# Patient Record
Sex: Female | Born: 1975 | Race: White | Hispanic: No | Smoking: Former smoker
Health system: Southern US, Community
[De-identification: ages and names within clinical notes are randomized; demographics above are authoritative.]

## PROBLEM LIST (undated history)

## (undated) DIAGNOSIS — Z87442 Personal history of urinary calculi: Secondary | ICD-10-CM

## (undated) DIAGNOSIS — E669 Obesity, unspecified: Secondary | ICD-10-CM

## (undated) DIAGNOSIS — O24414 Gestational diabetes mellitus in pregnancy, insulin controlled: Secondary | ICD-10-CM

## (undated) DIAGNOSIS — Z8759 Personal history of other complications of pregnancy, childbirth and the puerperium: Secondary | ICD-10-CM

## (undated) HISTORY — PX: RECTOVAGINAL FISTULA CLOSURE: SUR265

---

## 2005-12-15 ENCOUNTER — Ambulatory Visit: Payer: Self-pay | Admitting: Family Medicine

## 2006-08-29 ENCOUNTER — Ambulatory Visit: Payer: Self-pay | Admitting: Family Medicine

## 2006-09-04 ENCOUNTER — Ambulatory Visit: Payer: Self-pay | Admitting: Family Medicine

## 2006-12-19 ENCOUNTER — Ambulatory Visit: Payer: Self-pay | Admitting: Family Medicine

## 2006-12-21 ENCOUNTER — Ambulatory Visit: Payer: Self-pay | Admitting: Family Medicine

## 2013-06-03 ENCOUNTER — Encounter (HOSPITAL_COMMUNITY): Payer: Self-pay

## 2013-06-03 ENCOUNTER — Emergency Department (HOSPITAL_COMMUNITY): Payer: Self-pay

## 2013-06-03 ENCOUNTER — Emergency Department (HOSPITAL_COMMUNITY)
Admission: EM | Admit: 2013-06-03 | Discharge: 2013-06-03 | Disposition: A | Discharge status: Home / Self Care | Payer: Self-pay | Attending: Emergency Medicine | Admitting: Emergency Medicine

## 2013-06-03 DIAGNOSIS — F17200 Nicotine dependence, unspecified, uncomplicated: Secondary | ICD-10-CM | POA: Insufficient documentation

## 2013-06-03 DIAGNOSIS — N23 Unspecified renal colic: Secondary | ICD-10-CM | POA: Insufficient documentation

## 2013-06-03 DIAGNOSIS — N200 Calculus of kidney: Secondary | ICD-10-CM | POA: Insufficient documentation

## 2013-06-03 DIAGNOSIS — R112 Nausea with vomiting, unspecified: Secondary | ICD-10-CM | POA: Insufficient documentation

## 2013-06-03 DIAGNOSIS — Z3202 Encounter for pregnancy test, result negative: Secondary | ICD-10-CM | POA: Insufficient documentation

## 2013-06-03 LAB — URINALYSIS, ROUTINE W REFLEX MICROSCOPIC
Bilirubin Urine: NEGATIVE
Glucose, UA: NEGATIVE mg/dL
Nitrite: NEGATIVE
Specific Gravity, Urine: >1.03 — ABNORMAL HIGH (ref 1.005–1.030)
pH: 5.5 (ref 5.0–8.0)

## 2013-06-03 LAB — URINE MICROSCOPIC-ADD ON

## 2013-06-03 MED ORDER — METOCLOPRAMIDE HCL 5 MG/ML IJ SOLN
10.0000 mg | Freq: Once | INTRAMUSCULAR | Status: AC
  Administered 2013-06-03: 10 mg via INTRAVENOUS
  Filled 2013-06-03: qty 2

## 2013-06-03 MED ORDER — METOCLOPRAMIDE HCL 10 MG PO TABS: 10.0000 mg | ORAL_TABLET | Freq: Four times a day (QID) | ORAL | Status: DC | PRN

## 2013-06-03 MED ORDER — OXYCODONE-ACETAMINOPHEN 5-325 MG PO TABS: 2.0000 | ORAL_TABLET | ORAL | Status: DC | PRN

## 2013-06-03 MED ORDER — SODIUM CHLORIDE 0.9 % IV BOLUS (SEPSIS)
500.0000 mL | Freq: Once | INTRAVENOUS | Status: AC
  Administered 2013-06-03: 500 mL via INTRAVENOUS

## 2013-06-03 MED ORDER — ONDANSETRON 8 MG PO TBDP: ORAL_TABLET | ORAL | Status: DC

## 2013-06-03 MED ORDER — KETOROLAC TROMETHAMINE 30 MG/ML IJ SOLN
30.0000 mg | Freq: Once | INTRAMUSCULAR | Status: AC
  Administered 2013-06-03: 30 mg via INTRAVENOUS
  Filled 2013-06-03: qty 1

## 2013-06-03 MED ORDER — HYDROMORPHONE HCL PF 1 MG/ML IJ SOLN
1.0000 mg | Freq: Once | INTRAMUSCULAR | Status: AC
  Administered 2013-06-03: 1 mg via INTRAVENOUS
  Filled 2013-06-03: qty 1

## 2013-06-03 NOTE — ED Notes (Signed)
Pt reports chills and right flank pain for 2 hours pta, denies any blood in her urine, no h/o stones.  +nausea and vomited x1.  no fever.

## 2013-06-03 NOTE — ED Provider Notes (Signed)
History     This chart was scribed for Hurman Horn, MD, MD by Smitty Pluck, ED Scribe. The patient was seen in room APA18/APA18 and the patient's care was started at 10:24 AM.   CSN: [id number]  Arrival date & time 06/03/13  1011   Chief Complaint  Patient presents with  . Flank Pain  . Nausea    The history is provided by the patient and medical records. No language interpreter was used.   HPI Comments: Martha Morgan is a 37 y.o. female who presents to the Emergency Department complaining of constant, severe right flank pain onset 2 hours PTA today with sudden onset. She reports that she has associated nausea and vomiting. Pt denies being pregnant. Pt denies blood in stool, fever, chills, radiation of pain, dysuria, vaginal discharge, leg pain, diarrhea, weakness, cough, SOB and any other pain. Denies hx of similar pain.    History reviewed. No pertinent past medical history.  Past Surgical History  Procedure Laterality Date  . Rectovaginal fistula closure      No family history on file.  History  Substance Use Topics  . Smoking status: Current Every Day Smoker  . Smokeless tobacco: Not on file  . Alcohol Use: No    OB History   Grav Para Term Preterm Abortions TAB SAB Ect Mult Living                  Review of Systems A complete 10 system review of systems was obtained and all systems are negative except as noted in the HPI and PMH.   Allergies  Review of patient's allergies indicates no known allergies.  Home Medications   Current Outpatient Rx  Name  Route  Sig  Dispense  Refill  . metoCLOPramide (REGLAN) 10 MG tablet   Oral   Take 1 tablet (10 mg total) by mouth every 6 (six) hours as needed (nausea/headache).   6 tablet   0   . ondansetron (ZOFRAN ODT) 8 MG disintegrating tablet      8mg  ODT q4 hours prn nausea   4 tablet   0   . oxyCODONE-acetaminophen (PERCOCET) 5-325 MG per tablet   Oral   Take 2 tablets by mouth every 4 (four) hours as  needed for pain.   20 tablet   0     BP 114/73  Pulse 66  Temp(Src) 97.8 F (36.6 C) (Oral)  Resp 16  Ht 5\' 6"  (1.676 m)  Wt 191 lb (86.637 kg)  BMI 30.84 kg/m2  SpO2 100%  LMP 05/10/2013  Physical Exam  Nursing note and vitals reviewed. Constitutional:  Awake, alert, nontoxic appearance.  HENT:  Head: Atraumatic.  Eyes: Right eye exhibits no discharge. Left eye exhibits no discharge.  Neck: Neck supple.  Pulmonary/Chest: Effort normal. She exhibits no tenderness.  Abdominal: Soft. There is no rebound and no guarding.  Mild tenderness to right abdomen Mild right CVA tenderness No left CVA tenderness   Musculoskeletal: She exhibits no tenderness.  Baseline ROM, no obvious new focal weakness. No mid back tenderness  Neurological:  Mental status and motor strength appears baseline for patient and situation.  Skin: No rash noted.  Psychiatric: She has a normal mood and affect.    ED Course  Procedures (including critical care time) DIAGNOSTIC STUDIES: Oxygen Saturation is 100% on room air, normal by my interpretation.    COORDINATION OF CARE: 10:26 AM Patient / Family / Caregiver understand and agree with initial ED impression and  plan with expectations set for ED visit.  Pt feels improved after observation and/or treatment in ED.Patient / Family / Caregiver informed of clinical course, understand medical decision-making process, and agree with plan. Medications  sodium chloride 0.9 % bolus 500 mL (0 mLs Intravenous Stopped 06/03/13 1154)  HYDROmorphone (DILAUDID) injection 1 mg (1 mg Intravenous Given 06/03/13 1047)  ketorolac (TORADOL) 30 MG/ML injection 30 mg (30 mg Intravenous Given 06/03/13 1047)  metoCLOPramide (REGLAN) injection 10 mg (10 mg Intravenous Given 06/03/13 1046)      Labs Reviewed  URINALYSIS, ROUTINE W REFLEX MICROSCOPIC - Abnormal; Notable for the following:    Specific Gravity, Urine >1.030 (*)    Hgb urine dipstick LARGE (*)    Protein, ur  TRACE (*)    All other components within normal limits  URINE MICROSCOPIC-ADD ON - Abnormal; Notable for the following:    Squamous Epithelial / LPF FEW (*)    All other components within normal limits  PREGNANCY, URINE   Ct Abdomen Pelvis Wo Contrast  06/03/2013   *RADIOLOGY REPORT*  Clinical Data:  Right-sided flank pain.  History stones.  CT ABDOMEN AND PELVIS WITHOUT CONTRAST (CT UROGRAM)  Technique: Contiguous axial images of the abdomen and pelvis without oral or intravenous contrast were obtained.  Comparison: None  Findings:  Exam is limited for evaluation of entities other than urinary tract calculi due to lack of oral or intravenous contrast.   Lung bases:  Normal  Abdomen/pelvis:  Moderate hepatic steatosis, without focal liver lesions.  There is sparing of steatosis adjacent the gallbladder.  Normal spleen, stomach, pancreas, gallbladder, biliary tract, adrenal glands.  Bilateral nephrolithiasis.  Mild to moderate right-sided hydroureteronephrosis.  This continues to the level of 5 mm distal right ureteric calculus.  No retroperitoneal or retrocrural adenopathy.  Scattered colonic diverticula.  Normal terminal ileum and appendix. Normal small bowel without abdominal ascites.    No pelvic adenopathy.    Normal urinary bladder and uterus.  No adnexal mass.  Bones/Musculoskeletal:  No acute osseous abnormality.  Disc bulges at L4-L5 and L5-S1.  IMPRESSION:  1.  Mild to moderate obstruction secondary to a distal right ureteric calculus. 2.  Bilateral nephrolithiasis. 3.  Hepatic steatosis. 4.  Normal appendix.   Original Report Authenticated By: Jeronimo Greaves, M.D.     1. Renal colic on right side   2. Nephrolithiasis       MDM  I doubt any other EMC precluding discharge at this time including, but not necessarily limited to the following:SBI.      I personally performed the services described in this documentation, which was scribed in my presence. The recorded information has been  reviewed and is accurate.    Hurman Horn, MD 06/03/13 2230

## 2014-08-18 ENCOUNTER — Inpatient Hospital Stay (HOSPITAL_COMMUNITY)
Admission: EM | Admit: 2014-08-18 | Discharge: 2014-08-20 | DRG: 776 | Disposition: A | Discharge status: Home / Self Care | Payer: Medicaid Other | Attending: Family Medicine | Admitting: Family Medicine

## 2014-08-18 ENCOUNTER — Encounter (HOSPITAL_COMMUNITY): Payer: Self-pay | Admitting: Emergency Medicine

## 2014-08-18 ENCOUNTER — Emergency Department (HOSPITAL_COMMUNITY): Payer: Medicaid Other

## 2014-08-18 DIAGNOSIS — Z87891 Personal history of nicotine dependence: Secondary | ICD-10-CM | POA: Diagnosis not present

## 2014-08-18 DIAGNOSIS — R030 Elevated blood-pressure reading, without diagnosis of hypertension: Secondary | ICD-10-CM | POA: Diagnosis not present

## 2014-08-18 DIAGNOSIS — R609 Edema, unspecified: Secondary | ICD-10-CM

## 2014-08-18 DIAGNOSIS — I5031 Acute diastolic (congestive) heart failure: Secondary | ICD-10-CM | POA: Diagnosis present

## 2014-08-18 DIAGNOSIS — O1494 Unspecified pre-eclampsia, complicating childbirth: Secondary | ICD-10-CM | POA: Diagnosis present

## 2014-08-18 DIAGNOSIS — I498 Other specified cardiac arrhythmias: Secondary | ICD-10-CM | POA: Diagnosis present

## 2014-08-18 DIAGNOSIS — IMO0002 Reserved for concepts with insufficient information to code with codable children: Principal | ICD-10-CM | POA: Diagnosis present

## 2014-08-18 DIAGNOSIS — I059 Rheumatic mitral valve disease, unspecified: Secondary | ICD-10-CM

## 2014-08-18 DIAGNOSIS — E669 Obesity, unspecified: Secondary | ICD-10-CM | POA: Diagnosis present

## 2014-08-18 DIAGNOSIS — Z8759 Personal history of other complications of pregnancy, childbirth and the puerperium: Secondary | ICD-10-CM

## 2014-08-18 DIAGNOSIS — R000 Tachycardia, unspecified: Secondary | ICD-10-CM | POA: Diagnosis present

## 2014-08-18 DIAGNOSIS — Z8742 Personal history of other diseases of the female genital tract: Secondary | ICD-10-CM

## 2014-08-18 DIAGNOSIS — O24414 Gestational diabetes mellitus in pregnancy, insulin controlled: Secondary | ICD-10-CM

## 2014-08-18 DIAGNOSIS — O99815 Abnormal glucose complicating the puerperium: Secondary | ICD-10-CM | POA: Diagnosis present

## 2014-08-18 DIAGNOSIS — Z794 Long term (current) use of insulin: Secondary | ICD-10-CM

## 2014-08-18 DIAGNOSIS — R0902 Hypoxemia: Secondary | ICD-10-CM | POA: Diagnosis present

## 2014-08-18 DIAGNOSIS — O99810 Abnormal glucose complicating pregnancy: Secondary | ICD-10-CM

## 2014-08-18 HISTORY — DX: Personal history of other complications of pregnancy, childbirth and the puerperium: Z87.59

## 2014-08-18 HISTORY — DX: Obesity, unspecified: E66.9

## 2014-08-18 HISTORY — DX: Gestational diabetes mellitus in pregnancy, insulin controlled: O24.414

## 2014-08-18 LAB — URINALYSIS, ROUTINE W REFLEX MICROSCOPIC
BILIRUBIN URINE: NEGATIVE
GLUCOSE, UA: NEGATIVE mg/dL
KETONES UR: NEGATIVE mg/dL
Nitrite: NEGATIVE
PROTEIN: 30 mg/dL — AB
Specific Gravity, Urine: <1.005 — ABNORMAL LOW (ref 1.005–1.030)
UROBILINOGEN UA: 0.2 mg/dL (ref 0.0–1.0)
pH: 6.5 (ref 5.0–8.0)

## 2014-08-18 LAB — COMPREHENSIVE METABOLIC PANEL
ALBUMIN: 2.3 g/dL — AB (ref 3.5–5.2)
ALK PHOS: 117 U/L (ref 39–117)
ALT: 31 U/L (ref 0–35)
ANION GAP: 11 (ref 5–15)
AST: 35 U/L (ref 0–37)
BILIRUBIN TOTAL: 0.2 mg/dL — AB (ref 0.3–1.2)
BUN: 9 mg/dL (ref 6–23)
CHLORIDE: 103 meq/L (ref 96–112)
CO2: 26 mEq/L (ref 19–32)
CREATININE: 0.77 mg/dL (ref 0.50–1.10)
Calcium: 8.3 mg/dL — ABNORMAL LOW (ref 8.4–10.5)
GLUCOSE: 138 mg/dL — AB (ref 70–99)
POTASSIUM: 4.3 meq/L (ref 3.7–5.3)
Sodium: 140 mEq/L (ref 137–147)
Total Protein: 6.3 g/dL (ref 6.0–8.3)

## 2014-08-18 LAB — TSH: TSH: 0.975 u[IU]/mL (ref 0.350–4.500)

## 2014-08-18 LAB — URINE MICROSCOPIC-ADD ON

## 2014-08-18 LAB — PRO B NATRIURETIC PEPTIDE: PRO B NATRI PEPTIDE: 471.9 pg/mL — AB (ref 0–125)

## 2014-08-18 LAB — BLOOD GAS, ARTERIAL
ACID-BASE EXCESS: 1.2 mmol/L (ref 0.0–2.0)
BICARBONATE: 24.6 meq/L — AB (ref 20.0–24.0)
DRAWN BY: 27407
O2 CONTENT: 2 L/min
O2 SAT: 95.1 %
PCO2 ART: 34.3 mmHg — AB (ref 35.0–45.0)
PO2 ART: 74.9 mmHg — AB (ref 80.0–100.0)
Patient temperature: 37
TCO2: 23.2 mmol/L (ref 0–100)
pH, Arterial: 7.47 — ABNORMAL HIGH (ref 7.350–7.450)

## 2014-08-18 LAB — CBC WITH DIFFERENTIAL/PLATELET
BASOS PCT: 0 % (ref 0–1)
Basophils Absolute: 0 10*3/uL (ref 0.0–0.1)
Eosinophils Absolute: 0.2 10*3/uL (ref 0.0–0.7)
Eosinophils Relative: 2 % (ref 0–5)
HEMATOCRIT: 24.6 % — AB (ref 36.0–46.0)
HEMOGLOBIN: 7.7 g/dL — AB (ref 12.0–15.0)
LYMPHS ABS: 1 10*3/uL (ref 0.7–4.0)
Lymphocytes Relative: 9 % — ABNORMAL LOW (ref 12–46)
MCH: 25.5 pg — AB (ref 26.0–34.0)
MCHC: 31.3 g/dL (ref 30.0–36.0)
MCV: 81.5 fL (ref 78.0–100.0)
MONO ABS: 0.5 10*3/uL (ref 0.1–1.0)
MONOS PCT: 5 % (ref 3–12)
NEUTROS ABS: 9 10*3/uL — AB (ref 1.7–7.7)
NEUTROS PCT: 84 % — AB (ref 43–77)
Platelets: 327 10*3/uL (ref 150–400)
RBC: 3.02 MIL/uL — AB (ref 3.87–5.11)
RDW: 14.9 % (ref 11.5–15.5)
WBC: 10.8 10*3/uL — ABNORMAL HIGH (ref 4.0–10.5)

## 2014-08-18 LAB — GLUCOSE, CAPILLARY
GLUCOSE-CAPILLARY: 98 mg/dL (ref 70–99)
GLUCOSE-CAPILLARY: 100 mg/dL — AB (ref 70–99)
Glucose-Capillary: 86 mg/dL (ref 70–99)

## 2014-08-18 LAB — MAGNESIUM: Magnesium: 1.9 mg/dL (ref 1.5–2.5)

## 2014-08-18 LAB — PROTEIN / CREATININE RATIO, URINE
CREATININE, URINE: 47.3 mg/dL
PROTEIN CREATININE RATIO: 0.68 — AB (ref 0.00–0.15)
TOTAL PROTEIN, URINE: 32 mg/dL

## 2014-08-18 LAB — TROPONIN I: Troponin I: <0.3 ng/mL (ref <0.30)

## 2014-08-18 LAB — PROTIME-INR
INR: 0.89 (ref 0.00–1.49)
PROTHROMBIN TIME: 12.1 s (ref 11.6–15.2)

## 2014-08-18 LAB — MRSA PCR SCREENING: MRSA by PCR: NEGATIVE

## 2014-08-18 MED ORDER — MAGNESIUM SULFATE 40 MG/ML IJ SOLN
INTRAMUSCULAR | Status: AC
  Filled 2014-08-18: qty 50

## 2014-08-18 MED ORDER — FUROSEMIDE 10 MG/ML IJ SOLN
10.0000 mg | Freq: Once | INTRAMUSCULAR | Status: AC
  Administered 2014-08-18: 10 mg via INTRAVENOUS
  Filled 2014-08-18: qty 2

## 2014-08-18 MED ORDER — ENOXAPARIN SODIUM 60 MG/0.6ML ~~LOC~~ SOLN
50.0000 mg | SUBCUTANEOUS | Status: DC
  Administered 2014-08-19 – 2014-08-20 (×2): 50 mg via SUBCUTANEOUS
  Filled 2014-08-18 (×2): qty 0.6

## 2014-08-18 MED ORDER — TETANUS-DIPHTH-ACELL PERTUSSIS 5-2.5-18.5 LF-MCG/0.5 IM SUSP
0.5000 mL | Freq: Once | INTRAMUSCULAR | Status: DC
  Filled 2014-08-18: qty 0.5

## 2014-08-18 MED ORDER — LABETALOL HCL 5 MG/ML IV SOLN
10.0000 mg | Freq: Once | INTRAVENOUS | Status: AC
  Administered 2014-08-18: 10 mg via INTRAVENOUS
  Filled 2014-08-18: qty 4

## 2014-08-18 MED ORDER — ENOXAPARIN SODIUM 40 MG/0.4ML ~~LOC~~ SOLN: 40.0000 mg | SUBCUTANEOUS | Status: DC

## 2014-08-18 MED ORDER — SENNOSIDES-DOCUSATE SODIUM 8.6-50 MG PO TABS: 2.0000 | ORAL_TABLET | ORAL | Status: DC

## 2014-08-18 MED ORDER — ONDANSETRON HCL 4 MG PO TABS: 4.0000 mg | ORAL_TABLET | ORAL | Status: DC | PRN

## 2014-08-18 MED ORDER — WITCH HAZEL-GLYCERIN EX PADS: 1.0000 "application " | MEDICATED_PAD | CUTANEOUS | Status: DC | PRN

## 2014-08-18 MED ORDER — SIMETHICONE 80 MG PO CHEW
80.0000 mg | CHEWABLE_TABLET | ORAL | Status: DC
  Administered 2014-08-20: 80 mg via ORAL
  Filled 2014-08-18: qty 1

## 2014-08-18 MED ORDER — LANOLIN HYDROUS EX OINT: 1.0000 "application " | TOPICAL_OINTMENT | CUTANEOUS | Status: DC | PRN

## 2014-08-18 MED ORDER — SODIUM CHLORIDE 0.9 % IJ SOLN
3.0000 mL | Freq: Two times a day (BID) | INTRAMUSCULAR | Status: DC
  Administered 2014-08-18 – 2014-08-19 (×3): 3 mL via INTRAVENOUS

## 2014-08-18 MED ORDER — ONDANSETRON HCL 4 MG/2ML IJ SOLN: 4.0000 mg | INTRAMUSCULAR | Status: DC | PRN

## 2014-08-18 MED ORDER — INSULIN ASPART 100 UNIT/ML ~~LOC~~ SOLN: 3.0000 [IU] | Freq: Three times a day (TID) | SUBCUTANEOUS | Status: DC

## 2014-08-18 MED ORDER — METOPROLOL TARTRATE 25 MG PO TABS
25.0000 mg | ORAL_TABLET | Freq: Two times a day (BID) | ORAL | Status: DC
  Administered 2014-08-18 – 2014-08-20 (×4): 25 mg via ORAL
  Filled 2014-08-18 (×5): qty 1

## 2014-08-18 MED ORDER — SODIUM CHLORIDE 0.9 % IV SOLN
250.0000 mL | INTRAVENOUS | Status: DC | PRN
  Administered 2014-08-19: 250 mL via INTRAVENOUS

## 2014-08-18 MED ORDER — MAGNESIUM SULFATE BOLUS VIA INFUSION
4.0000 g | Freq: Once | INTRAVENOUS | Status: AC
  Administered 2014-08-18: 4 g via INTRAVENOUS

## 2014-08-18 MED ORDER — DIPHENHYDRAMINE HCL 25 MG PO CAPS: 25.0000 mg | ORAL_CAPSULE | Freq: Four times a day (QID) | ORAL | Status: DC | PRN

## 2014-08-18 MED ORDER — SIMETHICONE 80 MG PO CHEW: 80.0000 mg | CHEWABLE_TABLET | ORAL | Status: DC | PRN

## 2014-08-18 MED ORDER — FUROSEMIDE 10 MG/ML IJ SOLN: 40.0000 mg | Freq: Once | INTRAMUSCULAR | Status: DC

## 2014-08-18 MED ORDER — ZOLPIDEM TARTRATE 5 MG PO TABS: 5.0000 mg | ORAL_TABLET | Freq: Every evening | ORAL | Status: DC | PRN

## 2014-08-18 MED ORDER — OXYCODONE HCL 5 MG PO TABS: 5.0000 mg | ORAL_TABLET | ORAL | Status: DC | PRN

## 2014-08-18 MED ORDER — MAGNESIUM SULFATE 40 G IN LACTATED RINGERS - SIMPLE
2.0000 g/h | INTRAVENOUS | Status: DC
  Administered 2014-08-18: 2 g/h via INTRAVENOUS
  Filled 2014-08-18: qty 500

## 2014-08-18 MED ORDER — DIBUCAINE 1 % RE OINT: 1.0000 "application " | TOPICAL_OINTMENT | RECTAL | Status: DC | PRN

## 2014-08-18 MED ORDER — SODIUM CHLORIDE 0.9 % IJ SOLN
3.0000 mL | INTRAMUSCULAR | Status: DC | PRN
  Administered 2014-08-19 (×2): 3 mL via INTRAVENOUS

## 2014-08-18 MED ORDER — SIMETHICONE 80 MG PO CHEW
80.0000 mg | CHEWABLE_TABLET | Freq: Three times a day (TID) | ORAL | Status: DC
  Administered 2014-08-20: 80 mg via ORAL
  Filled 2014-08-18: qty 1

## 2014-08-18 MED ORDER — LACTATED RINGERS IV SOLN
INTRAVENOUS | Status: DC
  Administered 2014-08-18: 12:00:00 via INTRAVENOUS

## 2014-08-18 MED ORDER — DOCUSATE SODIUM 100 MG PO CAPS
100.0000 mg | ORAL_CAPSULE | Freq: Two times a day (BID) | ORAL | Status: DC
  Filled 2014-08-18: qty 1

## 2014-08-18 MED ORDER — INSULIN ASPART 100 UNIT/ML ~~LOC~~ SOLN: 0.0000 [IU] | Freq: Three times a day (TID) | SUBCUTANEOUS | Status: DC

## 2014-08-18 MED ORDER — IOHEXOL 350 MG/ML SOLN
100.0000 mL | Freq: Once | INTRAVENOUS | Status: AC | PRN
  Administered 2014-08-18: 100 mL via INTRAVENOUS

## 2014-08-18 MED ORDER — PRENATAL MULTIVITAMIN CH
1.0000 | ORAL_TABLET | Freq: Every day | ORAL | Status: DC
  Filled 2014-08-18: qty 1

## 2014-08-18 MED ORDER — OXYCODONE-ACETAMINOPHEN 5-325 MG PO TABS
1.0000 | ORAL_TABLET | ORAL | Status: DC | PRN
  Administered 2014-08-18 – 2014-08-19 (×4): 1 via ORAL
  Filled 2014-08-18 (×4): qty 1

## 2014-08-18 MED ORDER — LABETALOL HCL 5 MG/ML IV SOLN: 10.0000 mg | INTRAVENOUS | Status: DC | PRN

## 2014-08-18 NOTE — ED Notes (Signed)
Pt reports having a c-section on Sept 2, states she started feeling sob last night approx 11 pm  Pt denies pain anywhere except with cough and movement

## 2014-08-18 NOTE — Progress Notes (Signed)
Echo Lab  2D Echocardiogram completed.  Sayvon Arterberry L Emmaleah Meroney, RDCS 08/18/2014 1:56 PM

## 2014-08-18 NOTE — ED Provider Notes (Signed)
CSN: [id number]     Arrival date & time 08/18/14  0509 History   First MD Initiated Contact with Patient 08/18/14 [phone]     Chief Complaint  Patient presents with  . Shortness of Breath     (Consider location/radiation/quality/duration/timing/severity/associated sxs/prior Treatment) HPI Patient presents with shortness of breath starting at 11 PM last night. She states the pain was gradual in onset. Patient states she is unable to take a full breath. She has a mild nonproductive cough. She states she has some mild chest pain with coughing. Denies fever or chills. Patient had a C-section 5 days ago. No complications from such. Patient has bilateral lower extremity swelling which she states is unchanged from her baseline.  History reviewed. No pertinent past medical history. Past Surgical History  Procedure Laterality Date  . Rectovaginal fistula closure    . Cesarean section     No family history on file. History  Substance Use Topics  . Smoking status: Former Games developer  . Smokeless tobacco: Not on file  . Alcohol Use: No   OB History   Grav Para Term Preterm Abortions TAB SAB Ect Mult Living                 Review of Systems  Constitutional: Negative for fever and chills.  Respiratory: Positive for cough, chest tightness and shortness of breath. Negative for wheezing.   Cardiovascular: Positive for chest pain and leg swelling. Negative for palpitations.  Gastrointestinal: Negative for nausea, vomiting and abdominal pain.  Musculoskeletal: Negative for back pain, neck pain and neck stiffness.  Skin: Negative for rash and wound.  Neurological: Negative for dizziness, weakness, light-headedness, numbness and headaches.  All other systems reviewed and are negative.     Allergies  Review of patient's allergies indicates no known allergies.  Home Medications   Prior to Admission medications   Medication Sig Start Date End Date Taking? Authorizing Provider  docusate sodium  (COLACE) 100 MG capsule Take 100 mg by mouth 2 (two) times daily.   Yes Historical Provider, MD  glyBURIDE (DIABETA) 5 MG tablet Take 5 mg by mouth daily with breakfast.   Yes Historical Provider, MD  ibuprofen (ADVIL,MOTRIN) 800 MG tablet Take 800 mg by mouth every 8 (eight) hours as needed.   Yes Historical Provider, MD  oxycodone (OXY-IR) 5 MG capsule Take 5 mg by mouth every 4 (four) hours as needed.   Yes Historical Provider, MD  metoCLOPramide (REGLAN) 10 MG tablet Take 1 tablet (10 mg total) by mouth every 6 (six) hours as needed (nausea/headache). 06/03/13   Hurman Horn, MD  ondansetron (ZOFRAN ODT) 8 MG disintegrating tablet  ODT q4 hours prn nausea 06/03/13   Hurman Horn, MD  oxyCODONE-acetaminophen (PERCOCET) 5-325 MG per tablet Take 2 tablets by mouth every 4 (four) hours as needed for pain. 06/03/13   Hurman Horn, MD   BP 152/104  Pulse 122  Temp(Src) 98.3 F (36.8 C) (Oral)  Resp 21  Ht  (1.676 m)  Wt 240 lb (108.863 kg)  BMI 38.76 kg/m2  SpO2 100% Physical Exam  Nursing note and vitals reviewed. Constitutional: She is oriented to person, place, and time. She appears well-developed and well-nourished. No distress.   tearful and anxious  HENT:  Head: Normocephalic and atraumatic.  Mouth/Throat: Oropharynx is clear and moist. No oropharyngeal exudate.  Eyes: EOM are normal. Pupils are equal, round, and reactive to light.  Neck: Normal range of motion. Neck supple.  Cardiovascular: Normal  rate and regular rhythm.   Pulmonary/Chest: Effort normal and breath sounds normal. No respiratory distress. She has no wheezes. She has no rales.  Tachypnea. Decreased air movement.  Abdominal: Soft. Bowel sounds are normal. She exhibits no distension and no mass. There is no tenderness. There is no rebound and no guarding.  Musculoskeletal: Normal range of motion. She exhibits no edema and no tenderness.  Bilateral 2+ pitting edema to the lower extremities. No definite calf  tenderness or swelling.  Neurological: She is alert and oriented to person, place, and time.  Ultram is without deficit sensation is grossly intact.  Skin: Skin is warm and dry. No rash noted. No erythema.    ED Course  Procedures (including critical care time) Labs Review Labs Reviewed  CBC WITH DIFFERENTIAL - Abnormal; Notable for the following:    WBC 10.8 (*)    RBC 3.02 (*)    Hemoglobin 7.7 (*)    HCT 24.6 (*)    MCH 25.5 (*)    Neutrophils Relative % 84 (*)    Neutro Abs 9.0 (*)    Lymphocytes Relative 9 (*)    All other components within normal limits  COMPREHENSIVE METABOLIC PANEL - Abnormal; Notable for the following:    Glucose, Bld 138 (*)    Calcium 8.3 (*)    Albumin 2.3 (*)    Total Bilirubin 0.2 (*)    All other components within normal limits  PRO B NATRIURETIC PEPTIDE - Abnormal; Notable for the following:    Pro B Natriuretic peptide (BNP) 471.9 (*)    All other components within normal limits  TROPONIN I  URINALYSIS, ROUTINE W REFLEX MICROSCOPIC  BLOOD GAS, ARTERIAL  MAGNESIUM    Imaging Review Ct Angio Chest Pe W/cm &/or Wo Cm  08/18/2014   CLINICAL DATA:  Shortness of breath.  C-section 8 days ago.  EXAM: CT ANGIOGRAPHY CHEST WITH CONTRAST  TECHNIQUE: Multidetector CT imaging of the chest was performed using the standard protocol during bolus administration of intravenous contrast. Multiplanar CT image reconstructions and MIPs were obtained to evaluate the vascular anatomy.  CONTRAST:  OMNIPAQUE IOHEXOL 350 MG/ML SOLN  COMPARISON:  None.  FINDINGS: Technically adequate study with moderately good opacification of the central and segmental pulmonary arteries. No focal filling defects. No evidence of significant pulmonary embolus.  Normal heart size. Normal caliber thoracic aorta. No significant lymphadenopathy in the chest. Esophagus is decompressed.  Small bilateral pleural effusions with atelectasis in the lung bases. No focal consolidation. No  pneumothorax. Airways appear patent with mild diffuse airways thickening suggesting bronchitic changes.  Included portions of the upper abdominal organs demonstrate no focal abnormality. Mild degenerative changes in the spine. No destructive bone lesions.  Review of the MIP images confirms the above findings.  IMPRESSION: No evidence of significant pulmonary embolus. Small bilateral pleural effusions with basilar atelectasis.   Electronically Signed   By: Burman Nieves M.D.   On: 08/18/2014 06:43   Dg Chest Port 1 View  08/18/2014   CLINICAL DATA:  Shortness of breath last night. C-section on September 2.  EXAM: PORTABLE CHEST - 1 VIEW  COMPARISON:  None.  FINDINGS: Shallow inspiration. Heart size and pulmonary vascularity are normal. Soft tissue attenuation in the lung bases. No focal airspace disease or consolidation is suggested. No pneumothorax. No blunting of costophrenic angles.  IMPRESSION: No active disease.   Electronically Signed   By: Burman Nieves M.D.   On: 08/18/2014 05:55     EKG  Interpretation None      Date: 08/18/2014  Rate: 129  Rhythm: sinus tachycardia  QRS Axis: normal  Intervals: normal  ST/T Wave abnormalities: normal  Conduction Disutrbances:none  Narrative Interpretation:   Old EKG Reviewed: none available   MDM   Final diagnoses:  None    Given tachycardia an acute onset of shortness of breath PE is a definite consideration as well as pulmonary edema from preeclampsia. Will get CT angio of the chest to evaluate.  Patient states her shortness of breath is mildly improved. Her blood pressure improved in the emergency department. CT without any acute findings. Mild elevation in her BNP  Discussed with Dr. Mahala Menghini. Recommends obtaining ABG and magnesium level. Will see the patient in emergency department and disposition.  Patient's blood pressure mildly elevated her initial improvement. We'll give dose of labetolol.   Loren Racer, MD 08/18/14 [phone]

## 2014-08-18 NOTE — ED Notes (Signed)
Medical records received from [place]. Pt informed that someone would have to be with her for baby to stay in room with her.

## 2014-08-18 NOTE — ED Notes (Signed)
Pt ambulated to restroom to provide urine specimen for urine protein

## 2014-08-18 NOTE — H&P (Signed)
[facility] History and Physical  Martha Morgan NUU:[id number] DOB: 08/18/1976 DOA: 08/18/2014  Referring physician: ED PCP: No PCP Per Patient  Specialists: OB-GYN  Chief Complaint: SOB, LE edema and pleurisy  HPI: 38 year old G4 P2 known class B. diabetic, pregnancy-induced hypertension, history of nephrolithiasis 06/03/13 recently delivered 08/13/14 for [facility] via C-section-she had her do this 24-hour 14 done which indicated she was developing eclampsia and subsequently decision was made 2 do section at 37 weeks.  She had delivery of a baby boy and is caring for the child very much by herself at home.  She went home and was doing fine until 11 p.m. 08/17/14 when she developed acute shortness of breath. States that she was developing "nasal congestion pain when coughing". She states she's been having swelling in her lower extremities for 2-3 weeks, no headaches, no blurred vision, no seizure-like activity. She states that she's not really been coughing up any sputum and was feeling fine on leaving the hospital. She's not a smoker not a drinker She denies any chest pain She denies any fever or chills or foul smelling lochia. Her line of incision across the section is clean and clear and she is had no pain there or not discharge from that area except when she coughs when she has some pain. She decided to come to the emergency room at [facility] because she has no one really to help take care of her child at home other than the sister-in-law.  Emergency room workup chest x-ray showed shallow inspiration no active disease CT angiogram chest showed small bilateral pleural effusions with basilar atelectasis EKG showed sinus tachycardia PR interval 0.12 QRS axis 45 questionable criterion for LVH LFTs are within normal limits at the lower limits in the low, total bilirubin is decreased  0.2 troponin 0.30, proBNP 471.9   hemoglobin 7.7 WBC 10.8 platelets 327 INR not  performed ABG 7.47/34/74 0.9  Review of Systems: 12 system organ reviewed and negative except as above   History reviewed. No pertinent past medical history. Past Surgical History  Procedure Laterality Date  . Rectovaginal fistula closure    . Cesarean section     Social History:  History   Social History Narrative  . No narrative on file    No Known Allergies  No family history on file.   Prior to Admission medications   Medication Sig Start Date End Date Taking? Authorizing Provider  docusate sodium (COLACE) 100 MG capsule Take 100 mg by mouth 2 (two) times daily.   Yes Historical Provider, MD  glyBURIDE (DIABETA) 5 MG tablet Take 5 mg by mouth daily with breakfast.   Yes Historical Provider, MD  ibuprofen (ADVIL,MOTRIN) 800 MG tablet Take 800 mg by mouth every 8 (eight) hours as needed.   Yes Historical Provider, MD  oxycodone (OXY-IR) 5 MG capsule Take 5 mg by mouth every 4 (four) hours as needed.   Yes Historical Provider, MD  metoCLOPramide (REGLAN) 10 MG tablet Take 1 tablet (10 mg total) by mouth every 6 (six) hours as needed (nausea/headache). 06/03/13   Hurman Horn, MD  ondansetron (ZOFRAN ODT) 8 MG disintegrating tablet  ODT q4 hours prn nausea 06/03/13   Hurman Horn, MD  oxyCODONE-acetaminophen (PERCOCET) 5-325 MG per tablet Take 2 tablets by mouth every 4 (four) hours as needed for pain. 06/03/13   Hurman Horn, MD   Physical Exam: Filed Vitals:   08/18/14 0530 08/18/14 0615 08/18/14 0630 08/18/14 0715  BP:  170/109 135/89 127/77 152/104  Pulse: 134 117 127 122  Temp:      TempSrc:      Resp: Height:      Weight:      SpO2: 98% 100% 96% 100%     General:  EOMI, NCAT  Eyes: no blurred or double vision-fundus appears to have a little bit of neovascularization but no significant htn silver-wiring  ENT: soft supple, non tender-sinuses not painfu  Neck: soft supple  Cardiovascular:  s1 s2 slighlty tachycardic  Respiratory: clear, no added  sound  Abdomen:  Soft, Pfannaenstein incision clean   Skin:  Grade 2 LE edema  Musculoskeletal:  rom intact  Psychiatric:  Euthymic but upset she has to be hopsitlized and tearful  Neurologic: reflexes 2-3/3, no clonus.    Labs on Admission:  Basic Metabolic Panel:  Recent Labs Lab 08/18/14 0533  NA 140  K 4.3  CL 103  CO2 26  GLUCOSE 138*  BUN 9  CREATININE 0.77  CALCIUM 8.3*   Liver Function Tests:  Recent Labs Lab 08/18/14 0533  AST 35  ALT 31  ALKPHOS 117  BILITOT 0.2*  PROT 6.3  ALBUMIN 2.3*   No results found for this basename: LIPASE, AMYLASE,  in the last 168 hours No results found for this basename: AMMONIA,  in the last 168 hours CBC:  Recent Labs Lab 08/18/14 0533  WBC 10.8*  NEUTROABS 9.0*  HGB 7.7*  HCT 24.6*  MCV 81.5  PLT 327   Cardiac Enzymes:  Recent Labs Lab 08/18/14 0533  TROPONINI <0.30    BNP (last 3 results)  Recent Labs  08/18/14 0533  PROBNP 471.9*   CBG: No results found for this basename: GLUCAP,  in the last 168 hours  Radiological Exams on Admission: Ct Angio Chest Pe W/cm &/or Wo Cm  08/18/2014   CLINICAL DATA:  Shortness of breath.  C-section 8 days ago.  EXAM: CT ANGIOGRAPHY CHEST WITH CONTRAST  TECHNIQUE: Multidetector CT imaging of the chest was performed using the standard protocol during bolus administration of intravenous contrast. Multiplanar CT image reconstructions and MIPs were obtained to evaluate the vascular anatomy.  CONTRAST:  OMNIPAQUE IOHEXOL 350 MG/ML SOLN  COMPARISON:  None.  FINDINGS: Technically adequate study with moderately good opacification of the central and segmental pulmonary arteries. No focal filling defects. No evidence of significant pulmonary embolus.  Normal heart size. Normal caliber thoracic aorta. No significant lymphadenopathy in the chest. Esophagus is decompressed.  Small bilateral pleural effusions with atelectasis in the lung bases. No focal consolidation. No  pneumothorax. Airways appear patent with mild diffuse airways thickening suggesting bronchitic changes.  Included portions of the upper abdominal organs demonstrate no focal abnormality. Mild degenerative changes in the spine. No destructive bone lesions.  Review of the MIP images confirms the above findings.  IMPRESSION: No evidence of significant pulmonary embolus. Small bilateral pleural effusions with basilar atelectasis.   Electronically Signed   By: Burman Nieves M.D.   On: 08/18/2014 06:43   Dg Chest Port 1 View  08/18/2014   CLINICAL DATA:  Shortness of breath last night. C-section on September 2.  EXAM: PORTABLE CHEST - 1 VIEW  COMPARISON:  None.  FINDINGS: Shallow inspiration. Heart size and pulmonary vascularity are normal. Soft tissue attenuation in the lung bases. No focal airspace disease or consolidation is suggested. No pneumothorax. No blunting of costophrenic angles.  IMPRESSION: No active disease.   Electronically Signed  By: Burman Nieves M.D.   On: 08/18/2014 05:55     Assessme nt/Plan Principal Problem:   Hypoxia-likely secondary to mild decompensated heart failure. Patient will need echocardiogram in view of proBNP although this could be falsely elevated given her postpartum state. Her EKG does not show any findings of specific LVH. Active Problems:   History of pregnancy induced hypertension-discussed case with Dr. Alexia Freestone of OB/GYN-DDX = postpartum cardiomyopathy given grand-multipara state & obesity, but compensated by the fact that she may also have mild preeclampsia as her blood pressures were 170/110 on admission to the emergency room-she recommends getting a protein creatinine ratio stat as well as starting magnesium sulfate 4 g IV now then 2 g every 2 hourly. She has accepted the patient to [facility] in [place] where holistic care of the infant and patient can be performed. I do not have records from [facility] however though should be obtained to  complete care--we will try to obtain them and fax them over to women's Recommend frequent checks of neurological state as well as reflexes that she was slightly hyperreflexic during exam initially. Dr. Alexia Freestone does not think that we need magnesium levels at this point in time as creatinine is stable   Gestational diabetes mellitus in pregnancy, insulin controlled-we will start her on a.c. at bedtime coverage for the time being but this can be discontinued if she seems to be worsening and she'll have thank you for coverage   Obesity-outpatient counseling   Sinus tachycardia-probably been secondary to change in physiology from postpartum state as well as hypertension induced by preeclampsia. Magnesium 2 started . She's been given a dose of labetalol which we will continue every 4 when necessary   Cc time >65 min Transferred to [facility] under Dr. Leggett-greatly appreciate care and assistance  Mahala Menghini [facility] Pager [phone]  If 7PM-7AM, please contact night-coverage [email or web address] Password [email or web address] 08/18/2014, 7:26 AM

## 2014-08-18 NOTE — ED Notes (Signed)
Pt currently refusing to let me check fundus and pad stating,"I'd rather you not right now." Pt has been very agitates since learning she is unable to go home. Pt states she does not need a new pad at this time. Incision site/ dressing is CDI, which was viewed along with hospitalist as he was checking incision site.

## 2014-08-18 NOTE — Consult Note (Signed)
Primary Physician: Primary Cardiologist:  New   HPI: Asked to see re abnormal echo  Martha Morgan is a 38 yo G4P2 with history of gestational DM, preg induced HTN, Underwent C section 08/13/14 at 37 wks due to concerns for preeclampsia  Patietn says her BP had been good (140s) She noted edema with pregnancy that developed over the past couple months Yesterday she was at home   Became acutely SOB last night  Went to [facility] ER  .  Presented to [facility] with SOB that started 9/6  Had been developing LE edema for 2 to 3 wks.  There BP was signif elev at 152/104  P 122.  BNP increased at 471  CT negative for PE  Small bilateral effusions.  Patietn transferred to [facility] She was given IV lasix 10 mg and has some improvement ni her breathing though not back to normal Complains of nonprod cough.    Prior to pregnancy denied SOB  In other pregnancies and post partum did not have SOB.  FHX signif for CHF in mother  Dx'd 6 years ago  No reported CAD    Past Medical History  Diagnosis Date  . History of pregnancy induced hypertension   . Obesity   . Gestational diabetes mellitus in pregnancy, insulin controlled     Medications Prior to Admission  Medication Sig Dispense Refill  . docusate sodium (COLACE) 100 MG capsule Take 100 mg by mouth 2 (two) times daily.      Marland Kitchen glyBURIDE (DIABETA) 5 MG tablet Take 5 mg by mouth daily with breakfast.      . ibuprofen (ADVIL,MOTRIN) 800 MG tablet Take 800 mg by mouth every 8 (eight) hours as needed for moderate pain.       Marland Kitchen oxyCODONE (OXY IR/ROXICODONE) 5 MG immediate release tablet Take 5-10 mg by mouth every 4 (four) hours as needed for severe pain.         Marland Kitchen docusate sodium  100 mg Oral BID  . [START ON 08/19/2014] enoxaparin (LOVENOX) injection  50 mg Subcutaneous Q24H  . insulin aspart  0-15 Units Subcutaneous TID WC  . magnesium sulfate      . magnesium sulfate      . prenatal multivitamin  1 tablet Oral Q1200  . [START ON 08/19/2014] senna-docusate  2  tablet Oral Q24H  . simethicone  80 mg Oral TID PC  . [START ON 08/19/2014] simethicone  80 mg Oral Q24H  . [START ON 08/19/2014] Tdap  0.5 mL Intramuscular Once    Infusions: . lactated ringers 50 mL/hr at 08/18/14 1219  . magnesium sulfate 2 g/hr (08/18/14 1200)    No Known Allergies  History   Social History  . Marital Status: Legally Separated    Spouse Name: N/A    Number of Children: N/A  . Years of Education: N/A   Occupational History  . Not on file.   Social History Main Topics  . Smoking status: Former Games developer  . Smokeless tobacco: Not on file  . Alcohol Use: No  . Drug Use: No  . Sexual Activity: Yes    Birth Control/ Protection: None   Other Topics Concern  . Not on file   Social History Narrative  . No narrative on file    No family history on file.  REVIEW OF SYSTEMS:  All systems reviewed  Negative to the above problem except as noted above.    PHYSICAL EXAM: Filed Vitals:   08/18/14 1611  BP:  Pulse: 106  Temp:   Resp:      Intake/Output Summary (Last 24 hours) at 08/18/14 1702 Last data filed at 08/18/14 1643  Gross per 24 hour  Intake 934.17 ml  Output   2850 ml  Net -1915.83 ml    General:  Well appearing. No respiratory difficulty HEENT: normal Neck: supple. no JVD. Carotids 2+ bilat; no bruits. No lymphadenopathy or thryomegaly appreciated. Cor: PMI nondisplaced. Regular rate & rhythm. No rubs, gallops or murmurs. Lungs: clear Abdomen: soft, nontender, nondistended. No hepatosplenomegaly. No bruits or masses. Good bowel sounds. Extremities: no cyanosis, clubbing, rash, edema Neuro: alert & oriented x 3, cranial nerves grossly intact. moves all 4 extremities w/o difficulty. Affect pleasant.  ECG:  Results for orders placed during the hospital encounter of 08/18/14 (from the past 24 hour(s))  CBC WITH DIFFERENTIAL     Status: Abnormal   Collection Time    08/18/14  5:33 AM      Result Value Ref Range   WBC 10.8 (*) 4.0 - 10.5  K/uL   RBC 3.02 (*) 3.87 - 5.11 MIL/uL   Hemoglobin 7.7 (*) 12.0 - 15.0 g/dL   HCT 53.6 (*) 64.4 - 03.4 %   MCV 81.5  78.0 - 100.0 fL   MCH 25.5 (*) 26.0 - 34.0 pg   MCHC 31.3  30.0 - 36.0 g/dL   RDW 74.2  59.5 - 63.8 %   Platelets 327  150 - 400 K/uL   Neutrophils Relative % 84 (*) 43 - 77 %   Neutro Abs 9.0 (*) 1.7 - 7.7 K/uL   Lymphocytes Relative 9 (*) 12 - 46 %   Lymphs Abs 1.0  0.7 - 4.0 K/uL   Monocytes Relative 5  3 - 12 %   Monocytes Absolute 0.5  0.1 - 1.0 K/uL   Eosinophils Relative 2  0 - 5 %   Eosinophils Absolute 0.2  0.0 - 0.7 K/uL   Basophils Relative 0  0 - 1 %   Basophils Absolute 0.0  0.0 - 0.1 K/uL  COMPREHENSIVE METABOLIC PANEL     Status: Abnormal   Collection Time    08/18/14  5:33 AM      Result Value Ref Range   Sodium 140  137 - 147 mEq/L   Potassium 4.3  3.7 - 5.3 mEq/L   Chloride 103  96 - 112 mEq/L   CO2 26  19 - 32 mEq/L   Glucose, Bld 138 (*) 70 - 99 mg/dL   BUN 9  6 - 23 mg/dL   Creatinine, Ser 7.56  0.50 - 1.10 mg/dL   Calcium 8.3 (*) 8.4 - 10.5 mg/dL   Total Protein 6.3  6.0 - 8.3 g/dL   Albumin 2.3 (*) 3.5 - 5.2 g/dL   AST 35  0 - 37 U/L   ALT 31  0 - 35 U/L   Alkaline Phosphatase 117  39 - 117 U/L   Total Bilirubin 0.2 (*) 0.3 - 1.2 mg/dL   GFR calc non Af Amer >90  >90 mL/min   GFR calc Af Amer >90  >90 mL/min   Anion gap 11  5 - 15  PRO B NATRIURETIC PEPTIDE     Status: Abnormal   Collection Time    08/18/14  5:33 AM      Result Value Ref Range   Pro B Natriuretic peptide (BNP) 471.9 (*) 0 - 125 pg/mL  TROPONIN I     Status: None   Collection  Time    08/18/14  5:33 AM      Result Value Ref Range   Troponin I <0.30  <0.30 ng/mL  MAGNESIUM     Status: None   Collection Time    08/18/14  5:33 AM      Result Value Ref Range   Magnesium 1.9  1.5 - 2.5 mg/dL  PROTIME-INR     Status: None   Collection Time    08/18/14  5:40 AM      Result Value Ref Range   Prothrombin Time 12.1  11.6 - 15.2 seconds   INR 0.89  0.00 - 1.49    BLOOD GAS, ARTERIAL     Status: Abnormal   Collection Time    08/18/14  7:50 AM      Result Value Ref Range   O2 Content 2.0     Delivery systems NASAL CANNULA     pH, Arterial 7.470 (*) 7.350 - 7.450   pCO2 arterial 34.3 (*) 35.0 - 45.0 mmHg   pO2, Arterial 74.9 (*) 80.0 - 100.0 mmHg   Bicarbonate 24.6 (*) 20.0 - 24.0 mEq/L   TCO2 23.2  0 - 100 mmol/L   Acid-Base Excess 1.2  0.0 - 2.0 mmol/L   O2 Saturation 95.1     Martha Morgan temperature 37.0     Collection site RIGHT RADIAL     Drawn by [phone]     Sample type ARTERIAL DRAW     Allens test (pass/fail) PASS  PASS  URINALYSIS, ROUTINE W REFLEX MICROSCOPIC     Status: Abnormal   Collection Time    08/18/14  9:36 AM      Result Value Ref Range   Color, Urine ORANGE (*) YELLOW   APPearance HAZY (*) CLEAR   Specific Gravity, Urine <1.005 (*) 1.005 - 1.030   pH 6.5  5.0 - 8.0   Glucose, UA NEGATIVE  NEGATIVE mg/dL   Hgb urine dipstick LARGE (*) NEGATIVE   Bilirubin Urine NEGATIVE  NEGATIVE   Ketones, ur NEGATIVE  NEGATIVE mg/dL   Protein, ur 30 (*) NEGATIVE mg/dL   Urobilinogen, UA 0.2  0.0 - 1.0 mg/dL   Nitrite NEGATIVE  NEGATIVE   Leukocytes, UA TRACE (*) NEGATIVE  PROTEIN / CREATININE RATIO, URINE     Status: Abnormal   Collection Time    08/18/14  9:36 AM      Result Value Ref Range   Creatinine, Urine 47.30     Total Protein, Urine 32     PROTEIN CREATININE RATIO 0.68 (*) 0.00 - 0.15  URINE MICROSCOPIC-ADD ON     Status: Abnormal   Collection Time    08/18/14  9:36 AM      Result Value Ref Range   WBC, UA 3-6  <3 WBC/hpf   RBC / HPF TOO NUMEROUS TO COUNT  <3 RBC/hpf   Bacteria, UA FEW (*) RARE  GLUCOSE, CAPILLARY     Status: None   Collection Time    08/18/14 12:24 PM      Result Value Ref Range   Glucose-Capillary 98  70 - 99 mg/dL  MRSA PCR SCREENING     Status: None   Collection Time    08/18/14  1:05 PM      Result Value Ref Range   MRSA by PCR NEGATIVE  NEGATIVE  TSH     Status: None   Collection Time     08/18/14  2:35 PM      Result Value Ref Range  TSH 0.975  0.350 - 4.500 uIU/mL   Ct Angio Chest Pe W/cm &/or Wo Cm  08/18/2014   CLINICAL DATA:  Shortness of breath.  C-section 8 days ago.  EXAM: CT ANGIOGRAPHY CHEST WITH CONTRAST  TECHNIQUE: Multidetector CT imaging of the chest was performed using the standard protocol during bolus administration of intravenous contrast. Multiplanar CT image reconstructions and MIPs were obtained to evaluate the vascular anatomy.  CONTRAST:  OMNIPAQUE IOHEXOL 350 MG/ML SOLN  COMPARISON:  None.  FINDINGS: Technically adequate study with moderately good opacification of the central and segmental pulmonary arteries. No focal filling defects. No evidence of significant pulmonary embolus.  Normal heart size. Normal caliber thoracic aorta. No significant lymphadenopathy in the chest. Esophagus is decompressed.  Small bilateral pleural effusions with atelectasis in the lung bases. No focal consolidation. No pneumothorax. Airways appear patent with mild diffuse airways thickening suggesting bronchitic changes.  Included portions of the upper abdominal organs demonstrate no focal abnormality. Mild degenerative changes in the spine. No destructive bone lesions.  Review of the MIP images confirms the above findings.  IMPRESSION: No evidence of significant pulmonary embolus. Small bilateral pleural effusions with basilar atelectasis.   Electronically Signed   By: Burman Nieves M.D.   On: 08/18/2014 06:43   Dg Chest Port 1 View  08/18/2014   CLINICAL DATA:  Shortness of breath last night. C-section on September 2.  EXAM: PORTABLE CHEST - 1 VIEW  COMPARISON:  None.  FINDINGS: Shallow inspiration. Heart size and pulmonary vascularity are normal. Soft tissue attenuation in the lung bases. No focal airspace disease or consolidation is suggested. No pneumothorax. No blunting of costophrenic angles.  IMPRESSION: No active disease.   Electronically Signed   By: Burman Nieves M.D.    On: 08/18/2014 05:55   08/18/14  Echo Conclusions - Left ventricle: Poor acoustic windows limit study Endocardium is difficult to see. LVEF appears depressed with hypokinesis of the distal anterior, distal lateral, distal inferior and apical walls. Posterior wall endocardium is difficult to see. DIfficult to fully assess LVEF/ Would recomm limited study with echo contrast to evaluate wall motion and furher define LVEF The cavity size was normal. Wall thickness was increased in a pattern of mild LVH. - Mitral valve: There was mild regurgitation. - Right ventricle: RV free wall is difficult to see. Difficult to evaluate function though RVEF is probably normal.     ASSESSMENT:  Martha Morgan is a 38 who presented last night acutely SOB  Has responded some to lasix with improvement in symtpoms, though not resolution  She is net negative 1.9 L   On exam BP 140s/  HR 110-120s   She probably still has some increased fluid on exam, breathing is not back to normal Echo today was difficult, but LVEF does appear to be down  I would recomm a limited echo tomorrow with IV echo contrast to define wall motion/LVEF. For now, I would cut back on IV fluids.  I am not convinced this is preeclampsia/eclampsia given echo findings.  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  I would begin low dose b blocker   I would give an additional 10 mg lasix and follow labs. Will get echo in AM.  Further Rx based on that.   Discussed with Martha Morgan and family

## 2014-08-18 NOTE — ED Notes (Addendum)
Pt is adamant that she is wanting to go home to take care of new born. NAD. RT paged for ABG.

## 2014-08-18 NOTE — H&P (Signed)
Martha Morgan is a 39 y.o. female [phone] s/p LTCS at [redacted]w[redacted]d on 08/13/14 presenting as a direct admit from [facility] with SOB, cough, and lower extremity edema with elevated BPs (170-130s/100-70s). The patient had a c-section on 08/13/14 due to pre-eclampsia and class B diabetes, non-compliant with medication.  She states that her postpartum stay at [facility] was uneventful.  She was doing well until approximately 11pm last night (9/6) when she note SOB, nasal congestion, and coughing which has worsened gradually. She denies any pleuritic chest pain. 2 pillow orthopnea stable. Denies PND.  No  headache, vision change, RUQ pain, or change in swelling. She has had LE edema for approximately 4 weeks per the patient and there has been no change.  She states that prior to her delivery, she did not have any pre-E symptoms either, simply proteinuria and hypertension.  She denies fever, chills, or dizziness.  Lochia has appropriately decreased. She notes pain at the incision site when coughing but denies any erythema or drainage at the site. She is not breast feeding. Very stressed currently as she's the only one taking care of her newborn currently, as her husband is out of town. Her sister-in-law is currently taking care of her newborn son.   Past Medical History: Past Medical History  Diagnosis Date  . History of pregnancy induced hypertension   . Obesity   . Gestational diabetes mellitus in pregnancy, insulin controlled     Past Surgical History: Past Surgical History  Procedure Laterality Date  . Rectovaginal fistula closure    . Cesarean section      Obstetrical History: OB History   Grav Para Term Preterm Abortions TAB SAB Ect Mult Living   Obstetric Comments   Delivery at [facility] 08/13/[birth year]      Social History: History   Social History  . Marital Status: Legally Separated    Spouse Name: N/A    Number of Children: N/A  . Years of Education: N/A    Social History Main Topics  . Smoking status: Former Games developer  . Smokeless tobacco: None  . Alcohol Use: No  . Drug Use: No  . Sexual Activity: Yes    Birth Control/ Protection: None   Other Topics Concern  . None   Social History Narrative  . None    Family History:  Non-contributory   Allergies: No Known Allergies  Prescriptions prior to admission  Medication Sig Dispense Refill  . docusate sodium (COLACE) 100 MG capsule Take 100 mg by mouth 2 (two) times daily.      Marland Kitchen glyBURIDE (DIABETA) 5 MG tablet Take 5 mg by mouth daily with breakfast.      . ibuprofen (ADVIL,MOTRIN) 800 MG tablet Take 800 mg by mouth every 8 (eight) hours as needed for moderate pain.       Marland Kitchen oxyCODONE (OXY IR/ROXICODONE) 5 MG immediate release tablet Take 5-10 mg by mouth every 4 (four) hours as needed for severe pain.         Review of Systems   Constitutional: Denies fever, chills, or fatigue. No dysuria, urinary urgency, or change in bowel function  Blood pressure 148/93, pulse 113, temperature 97.4 F (36.3 C), temperature source Oral, resp. rate 20, height  (1.676 m), weight 108.863 kg (240 lb), SpO2 96.00%. General appearance: alert, cooperative and no distress Lungs: clear to auscultation bilaterally. No wheezing, rhonchi, or crackles noted  Heart: regular  rate and rhythm; no m/r/g noted. 1-2+ pitting edema b/l in the LE up to the knee Abdomen: soft, appropriately tender; bowel sounds normal. Incision site clean, dry, intact without erythema, edema, or drainage Extremities: Homans sign is negative, no sign of DVT DTR's 2+ in the UE bilaterally   Prenatal labs per limited records from [place]: 24hr proteinura elevated per notes (unknown quantity) Glucola >300 and initial blood sugars >200   Results for orders placed during the hospital encounter of 08/18/14 (from the past 24 hour(s))  CBC WITH DIFFERENTIAL   Collection Time    08/18/14  5:33 AM      Result Value Ref Range    WBC 10.8 (*) 4.0 - 10.5 K/uL   RBC 3.02 (*) 3.87 - 5.11 MIL/uL   Hemoglobin 7.7 (*) 12.0 - 15.0 g/dL   HCT 95.6 (*) 21.3 - 08.6 %   MCV 81.5  78.0 - 100.0 fL   MCH 25.5 (*) 26.0 - 34.0 pg   MCHC 31.3  30.0 - 36.0 g/dL   RDW 57.8  46.9 - 62.9 %   Platelets 327  150 - 400 K/uL   Neutrophils Relative % 84 (*) 43 - 77 %   Neutro Abs 9.0 (*) 1.7 - 7.7 K/uL   Lymphocytes Relative 9 (*) 12 - 46 %   Lymphs Abs 1.0  0.7 - 4.0 K/uL   Monocytes Relative 5  3 - 12 %   Monocytes Absolute 0.5  0.1 - 1.0 K/uL   Eosinophils Relative 2  0 - 5 %   Eosinophils Absolute 0.2  0.0 - 0.7 K/uL   Basophils Relative 0  0 - 1 %   Basophils Absolute 0.0  0.0 - 0.1 K/uL  COMPREHENSIVE METABOLIC PANEL   Collection Time    08/18/14  5:33 AM      Result Value Ref Range   Sodium 140  137 - 147 mEq/L   Potassium 4.3  3.7 - 5.3 mEq/L   Chloride 103  96 - 112 mEq/L   CO2 26  19 - 32 mEq/L   Glucose, Bld 138 (*) 70 - 99 mg/dL   BUN 9  6 - 23 mg/dL   Creatinine, Ser 5.28  0.50 - 1.10 mg/dL   Calcium 8.3 (*) 8.4 - 10.5 mg/dL   Total Protein 6.3  6.0 - 8.3 g/dL   Albumin 2.3 (*) 3.5 - 5.2 g/dL   AST 35  0 - 37 U/L   ALT 31  0 - 35 U/L   Alkaline Phosphatase 117  39 - 117 U/L   Total Bilirubin 0.2 (*) 0.3 - 1.2 mg/dL   GFR calc non Af Amer >90  >90 mL/min   GFR calc Af Amer >90  >90 mL/min   Anion gap 11  5 - 15  PRO B NATRIURETIC PEPTIDE   Collection Time    08/18/14  5:33 AM      Result Value Ref Range   Pro B Natriuretic peptide (BNP) 471.9 (*) 0 - 125 pg/mL  TROPONIN I   Collection Time    08/18/14  5:33 AM      Result Value Ref Range   Troponin I <0.30  <0.30 ng/mL  MAGNESIUM   Collection Time    08/18/14  5:33 AM      Result Value Ref Range   Magnesium 1.9  1.5 - 2.5 mg/dL  PROTIME-INR   Collection Time    08/18/14  5:40 AM      Result  Value Ref Range   Prothrombin Time 12.1  11.6 - 15.2 seconds   INR 0.89  0.00 - 1.49  BLOOD GAS, ARTERIAL   Collection Time    08/18/14  7:50 AM       Result Value Ref Range   O2 Content 2.0     Delivery systems NASAL CANNULA     pH, Arterial 7.470 (*) 7.350 - 7.450   pCO2 arterial 34.3 (*) 35.0 - 45.0 mmHg   pO2, Arterial 74.9 (*) 80.0 - 100.0 mmHg   Bicarbonate 24.6 (*) 20.0 - 24.0 mEq/L   TCO2 23.2  0 - 100 mmol/L   Acid-Base Excess 1.2  0.0 - 2.0 mmol/L   O2 Saturation 95.1     Patient temperature 37.0     Collection site RIGHT RADIAL     Drawn by [phone]     Sample type ARTERIAL DRAW     Allens test (pass/fail) PASS  PASS  URINALYSIS, ROUTINE W REFLEX MICROSCOPIC   Collection Time    08/18/14  9:36 AM      Result Value Ref Range   Color, Urine ORANGE (*) YELLOW   APPearance HAZY (*) CLEAR   Specific Gravity, Urine <1.005 (*) 1.005 - 1.030   pH 6.5  5.0 - 8.0   Glucose, UA NEGATIVE  NEGATIVE mg/dL   Hgb urine dipstick LARGE (*) NEGATIVE   Bilirubin Urine NEGATIVE  NEGATIVE   Ketones, ur NEGATIVE  NEGATIVE mg/dL   Protein, ur 30 (*) NEGATIVE mg/dL   Urobilinogen, UA 0.2  0.0 - 1.0 mg/dL   Nitrite NEGATIVE  NEGATIVE   Leukocytes, UA TRACE (*) NEGATIVE  PROTEIN / CREATININE RATIO, URINE   Collection Time    08/18/14  9:36 AM      Result Value Ref Range   Creatinine, Urine 47.30     Total Protein, Urine 32     PROTEIN CREATININE RATIO 0.68 (*) 0.00 - 0.15  URINE MICROSCOPIC-ADD ON   Collection Time    08/18/14  9:36 AM      Result Value Ref Range   WBC, UA 3-6  <3 WBC/hpf   RBC / HPF TOO NUMEROUS TO COUNT  <3 RBC/hpf   Bacteria, UA FEW (*) RARE    Assessment/Plan: Martha Morgan is a 38 y.o. [id number] POD#5 from a rLTCS due to pre-E and gestational diabetes found to have elevated BPs and a spot protein/creatinine ratio of 0.68.    #Elevated BPs and proteinuria concerning for postpartum pre-eclampsia: the pt has LE edema on exam which she feels is stable over the last few weeks. She denies any other pre-eclampsia symptoms currently.  - Admit to the ICU  - Continue to monitor BPs  -Magnesium sulfate: started at 4g IV  at [facility], then 2g IV every 2 hours after that - Labetalol IV PRN HTN   # SOB:  Given the onset and EKG finding of possible LVH, there is a concern for postpartum cardiomyopathy as well. Denies any pleuritic chest pain. She is at at high risk of PE, however CTA was negative; it did show small b/l pleural effusions with bibasilar atelectasis. CXR was unremarkable.  Possibly rhinitis.  - echocardiogram  - nasal saline to assist with nasal congestion; will avoid most decongestants due to BPs - Lasix IV  - Strict I&Os  # DM: Unsure if this is simply gestational or chronic now, as patient never followed up on gestational DM after her last child. First trimester glucola very elevated, >300. -  Monitor CBGs - Will hold glyburide  - SSI     Joanna Puff 08/18/2014, 11:50 AM   OB fellow attestation:  I have seen and examined this patient; I agree with above documentation in the resident's note.   Martha Morgan is a 38 y.o. [phone] on POD#5 status post rLTCS now with acute onset shortness of breath since last night. She was evaluated at an outside hospital. There she was found to be hypertensive and tachycardic. H/H stable from discharge. HELLP labs negative. BNP elevated. UPC elevated, but not clean catch sample. CXR negative. CTA with no clot, but was notable for small bilateral pleural effusions. Ultimately she was started on magnesium and transferred to [facility] for further evaluation and management.   PE: BP 142/94  Pulse 110  Temp(Src) 97.4 F (36.3 C) (Oral)  Resp 18  Ht  (1.676 m)  Wt 234 lb 11.2 oz (106.459 kg)  BMI 37.90 kg/m2  SpO2 95% Gen: pleasant, cooperative, mildly ill appearing, but in no acute distress Neck: no appreciable elevation in JVD CV: tachycardic, no S3. 2+ distal pulses.  Resp: no respiratory distress. Clear to auscultation throughout all lung fields. No crackles or wheezes.  Abd: soft, nontender, nondistended. No hepatomegaly.  Ext:  nonpitting edema to mid shins bilaterally  ROS, labs, PMH reviewed  Plan: 1. Dypsnea DDx includes preeclampsia, postpartum cardiomyopathy. Less likely anemia as H/H stable from discharge. Less likely infectious as afebrile and no evidence of pneumonia on imaging. Pulmonary embolism ruled out on CTA.  - Admit to AICU for close monitoring.  - Status post magnesium sulfate load. Continue 2 grams every 2 hours.  - Give Lasix 10 mg IV now.  - Obtain Echo  2. Post partum Pre-Eclampsia - Monitor blood pressure. Treat if > 160/110 with IV labetolol.  - Magnesium sulfate as above.   3. Diabetes - Holding glyburide 5 mg qAM as blood glucose levels have been low-normal - SSI   William Dalton, MD 1:27 PM

## 2014-08-18 NOTE — Progress Notes (Signed)
Telephone report received from Garrison [place], RN for transfer of pt from [facility] to AICU Rm [phone]

## 2014-08-19 DIAGNOSIS — IMO0002 Reserved for concepts with insufficient information to code with codable children: Secondary | ICD-10-CM

## 2014-08-19 DIAGNOSIS — R0989 Other specified symptoms and signs involving the circulatory and respiratory systems: Secondary | ICD-10-CM

## 2014-08-19 DIAGNOSIS — E669 Obesity, unspecified: Secondary | ICD-10-CM

## 2014-08-19 DIAGNOSIS — I5031 Acute diastolic (congestive) heart failure: Secondary | ICD-10-CM | POA: Diagnosis present

## 2014-08-19 DIAGNOSIS — R000 Tachycardia, unspecified: Secondary | ICD-10-CM

## 2014-08-19 DIAGNOSIS — R0609 Other forms of dyspnea: Secondary | ICD-10-CM

## 2014-08-19 LAB — BASIC METABOLIC PANEL
Anion gap: 10 (ref 5–15)
BUN: 11 mg/dL (ref 6–23)
CO2: 26 mEq/L (ref 19–32)
Calcium: 7.6 mg/dL — ABNORMAL LOW (ref 8.4–10.5)
Chloride: 100 mEq/L (ref 96–112)
Creatinine, Ser: 0.89 mg/dL (ref 0.50–1.10)
GFR, EST NON AFRICAN AMERICAN: 80 mL/min — AB (ref >90)
GLUCOSE: 117 mg/dL — AB (ref 70–99)
POTASSIUM: 4.1 meq/L (ref 3.7–5.3)
Sodium: 136 mEq/L — ABNORMAL LOW (ref 137–147)

## 2014-08-19 LAB — PRO B NATRIURETIC PEPTIDE: PRO B NATRI PEPTIDE: 814.2 pg/mL — AB (ref 0–125)

## 2014-08-19 LAB — GLUCOSE, CAPILLARY: Glucose-Capillary: 107 mg/dL — ABNORMAL HIGH (ref 70–99)

## 2014-08-19 MED ORDER — LEVALBUTEROL TARTRATE 45 MCG/ACT IN AERO: 1.0000 | INHALATION_SPRAY | Freq: Four times a day (QID) | RESPIRATORY_TRACT | Status: DC | PRN

## 2014-08-19 MED ORDER — FUROSEMIDE 10 MG/ML IJ SOLN
20.0000 mg | Freq: Once | INTRAMUSCULAR | Status: AC
  Administered 2014-08-19: 20 mg via INTRAVENOUS
  Filled 2014-08-19: qty 2

## 2014-08-19 MED ORDER — LEVALBUTEROL HCL 0.63 MG/3ML IN NEBU
0.6300 mg | INHALATION_SOLUTION | Freq: Four times a day (QID) | RESPIRATORY_TRACT | Status: DC | PRN
  Administered 2014-08-19: 0.63 mg via RESPIRATORY_TRACT
  Filled 2014-08-19: qty 3

## 2014-08-19 MED ORDER — ENOXAPARIN (LOVENOX) PATIENT EDUCATION KIT
PACK | Freq: Once | Status: DC
  Filled 2014-08-19: qty 1

## 2014-08-19 MED ORDER — FUROSEMIDE 10 MG/ML IJ SOLN
10.0000 mg | Freq: Once | INTRAMUSCULAR | Status: AC
  Administered 2014-08-19: 10 mg via INTRAVENOUS
  Filled 2014-08-19: qty 2

## 2014-08-19 MED ORDER — FUROSEMIDE 20 MG PO TABS
20.0000 mg | ORAL_TABLET | Freq: Two times a day (BID) | ORAL | Status: DC
  Administered 2014-08-19 – 2014-08-20 (×2): 20 mg via ORAL
  Filled 2014-08-19 (×2): qty 1

## 2014-08-19 MED ORDER — PERFLUTREN LIPID MICROSPHERE
1.0000 mL | INTRAVENOUS | Status: DC | PRN
  Administered 2014-08-19: 4 mL via INTRAVENOUS
  Filled 2014-08-19: qty 10

## 2014-08-19 MED ORDER — LIVING BETTER WITH HEART FAILURE BOOK
Freq: Once | Status: DC
  Filled 2014-08-19: qty 1

## 2014-08-19 NOTE — Progress Notes (Signed)
Patient ID: Martha Morgan, female   DOB: [birth year], 38 y.o.   MRN: [id number]  FACULTY PRACTICE PROGRESS NOTE  Laveda Demedeiros WUJ:[id number] DOB: [birth year] DOA: 08/18/2014 PCP: No PCP Per Patient  Assessment/Plan: 1. 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 We'll repeat ultrasound with contrast letter on today. Continue Lasix, metoprolol. Patient is also on Lovenox for DVT prophylaxis. She has diuresed 3.5 L - continue strict I.'s and O.'s and daily weights. Potassium stable  2. S/P LTCS, POD#6   Continue pain control, wound care, postoperative care 3. Gestational Diabetes  Has been off glyburide since delivery. CBG is under good control. We'll discontinue.  Consultants:  Cardiology  Procedures:  [place] with contrast pending  HPI/Subjective: Patient continues to have increased shortness of breath that has worsened with exertion. Additionally, she complains of worsening shortness of breath when she is laying flat and is better sitting up. She feels dyspneic at rest. She is tolerating medications well - both the metoprolol and Lasix. She denies worsening of her edema, weight gain, chest pain. Has some decreased appetite.  Objective: Filed Vitals:   08/19/14 0758  BP:   Pulse: 104  Temp:   Resp:     Intake/Output Summary (Last 24 hours) at 08/19/14 0923 Last data filed at 08/19/14 0750  Gross per 24 hour  Intake 1819.25 ml  Output   5500 ml  Net -3680.75 ml   Filed Weights   08/18/14 0523 08/18/14 1105 08/19/14 0600  Weight: 108.863 kg (240 lb) 106.459 kg (234 lb 11.2 oz) 103.375 kg (227 lb 14.4 oz)    Exam:   General:  Young Caucasian female who is awake and alert and oriented x3.  Cardiovascular: Regular rate with normal S1-S2 sounds. No murmurs auscultated.  Respiratory: Distant breath sounds, faint rales in the bases bilaterally  Abdomen: Soft, nontender, nondistended  Extremity: 2+ pitting edema from midshin to the  ankles.   Data Reviewed: Basic Metabolic Panel:  Recent Labs Lab 08/18/14 0533 08/19/14 0546  NA 140 136*  K 4.3 4.1  CL 103 100  CO2 26 26  GLUCOSE 138* 117*  BUN 9 11  CREATININE 0.77 0.89  CALCIUM 8.3* 7.6*  MG 1.9  --    Liver Function Tests:  Recent Labs Lab 08/18/14 0533  AST 35  ALT 31  ALKPHOS 117  BILITOT 0.2*  PROT 6.3  ALBUMIN 2.3*   No results found for this basename: LIPASE, AMYLASE,  in the last 168 hours No results found for this basename: AMMONIA,  in the last 168 hours CBC:  Recent Labs Lab 08/18/14 0533  WBC 10.8*  NEUTROABS 9.0*  HGB 7.7*  HCT 24.6*  MCV 81.5  PLT 327   Cardiac Enzymes:  Recent Labs Lab 08/18/14 0533  TROPONINI <0.30   BNP (last 3 results)  Recent Labs  08/18/14 0533 08/19/14 0546  PROBNP 471.9* 814.2*   CBG:  Recent Labs Lab 08/18/14 1224 08/18/14 1807 08/18/14 2213 08/19/14 0753  GLUCAP 98 100* 86 107*    Recent Results (from the past 240 hour(s))  MRSA PCR SCREENING     Status: None   Collection Time    08/18/14  1:05 PM      Result Value Ref Range Status   MRSA by PCR NEGATIVE  NEGATIVE Final   Comment:            The GeneXpert MRSA Assay (FDA     approved for NASAL specimens     only),  is one component of a     comprehensive MRSA colonization     surveillance program. It is not     intended to diagnose MRSA     infection nor to guide or     monitor treatment for     MRSA infections.     Studies: Ct Angio Chest Pe W/cm &/or Wo Cm  08/18/2014   CLINICAL DATA:  Shortness of breath.  C-section 8 days ago.  EXAM: CT ANGIOGRAPHY CHEST WITH CONTRAST  TECHNIQUE: Multidetector CT imaging of the chest was performed using the standard protocol during bolus administration of intravenous contrast. Multiplanar CT image reconstructions and MIPs were obtained to evaluate the vascular anatomy.  CONTRAST:  OMNIPAQUE IOHEXOL 350 MG/ML SOLN  COMPARISON:  None.  FINDINGS: Technically adequate study  with moderately good opacification of the central and segmental pulmonary arteries. No focal filling defects. No evidence of significant pulmonary embolus.  Normal heart size. Normal caliber thoracic aorta. No significant lymphadenopathy in the chest. Esophagus is decompressed.  Small bilateral pleural effusions with atelectasis in the lung bases. No focal consolidation. No pneumothorax. Airways appear patent with mild diffuse airways thickening suggesting bronchitic changes.  Included portions of the upper abdominal organs demonstrate no focal abnormality. Mild degenerative changes in the spine. No destructive bone lesions.  Review of the MIP images confirms the above findings.  IMPRESSION: No evidence of significant pulmonary embolus. Small bilateral pleural effusions with basilar atelectasis.   Electronically Signed   By: Burman Nieves M.D.   On: 08/18/2014 06:43   Dg Chest Port 1 View  08/18/2014   CLINICAL DATA:  Shortness of breath last night. C-section on September 2.  EXAM: PORTABLE CHEST - 1 VIEW  COMPARISON:  None.  FINDINGS: Shallow inspiration. Heart size and pulmonary vascularity are normal. Soft tissue attenuation in the lung bases. No focal airspace disease or consolidation is suggested. No pneumothorax. No blunting of costophrenic angles.  IMPRESSION: No active disease.   Electronically Signed   By: Burman Nieves M.D.   On: 08/18/2014 05:55    Scheduled Meds: . docusate sodium  100 mg Oral BID  . enoxaparin (LOVENOX) injection  50 mg Subcutaneous Q24H  . insulin aspart  0-15 Units Subcutaneous TID WC  . metoprolol tartrate  25 mg Oral BID  . prenatal multivitamin  1 tablet Oral Q1200  . senna-docusate  2 tablet Oral Q24H  . simethicone  80 mg Oral TID PC  . simethicone  80 mg Oral Q24H  . sodium chloride  3 mL Intravenous Q12H  . Tdap  0.5 mL Intramuscular Once   Continuous Infusions:   Principal Problem:   Hypoxia Active Problems:   History of pregnancy induced  hypertension   Gestational diabetes mellitus in pregnancy, insulin controlled   Obesity   Sinus tachycardia   Pre-eclampsia, delivered    Time spent: 25 minutes    Candelaria Celeste JEHIEL  Faculty Practice Phone: [phone] 08/19/2014, 9:23 AM  LOS: 1 day

## 2014-08-19 NOTE — Progress Notes (Addendum)
   Subjective:  Still "feels" SOB & suffocating, but looks better. Brisk UOP (~5L total out) HR a bit better  Objective:  Vital Signs in the last 24 hours: Temp:  [98.2 F (36.8 C)-98.6 F (37 C)] 98.6 F (37 C) (09/08 1553) Pulse Rate:  [82-119] 96 (09/08 1553) Resp:  [18-26] 18 (09/08 1553) BP: (119-157)/(73-105) 136/84 mmHg (09/08 1553) SpO2:  [93 %-100 %] 98 % (09/08 1553) Weight:  [227 lb 14.4 oz (103.375 kg)] 227 lb 14.4 oz (103.375 kg) (09/08 0600)  Intake/Output from previous day: 09/07 0701 - 09/08 0700 In: 1819.3 [P.O.:1280; I.V.:539.3] Out: 5200 [Urine:5200] Intake/Output from this shift: Total I/O In: 474.2 [P.O.:460; I.V.:14.2] Out: 2500 [Urine:2500]  Physical Exam: General appearance: alert, cooperative, appears stated age and no distress Neck: no adenopathy, no carotid bruit, no JVD and supple, symmetrical, trachea midline Lungs: clear to auscultation bilaterally, normal percussion bilaterally and except for mild exp wheeze - clears with cough Heart: regular rate and rhythm, S1, S2 normal, no murmur, click, rub or gallop and normal apical impulse Abdomen: soft, non-tender; bowel sounds normal; no masses,  no organomegaly and expected post-op tenderness Extremities: extremities normal, atraumatic, no cyanosis or edema Pulses: 2+ and symmetric Neurologic: Grossly normal  Lab Results:  Recent Labs  08/18/14 0533  WBC 10.8*  HGB 7.7*  PLT 327    Recent Labs  08/18/14 0533 08/19/14 0546  NA 140 136*  K 4.3 4.1  CL 103 100  CO2 26 26  GLUCOSE 138* 117*  BUN 9 11  CREATININE 0.77 0.89    Recent Labs  08/18/14 0533  TROPONINI <0.30   Hepatic Function Panel  Recent Labs  08/18/14 0533  PROT 6.3  ALBUMIN 2.3*  AST 35  ALT 31  ALKPHOS 117  BILITOT 0.2*   No results found for this basename: CHOL,  in the last 72 hours No results found for this basename: PROTIME,  in the last 72 hours  Cardiac Studies: Echo with Definity - EF ~55-60%  no regional WMA  Assessment/Plan:  Principal Problem:   Acute diastolic heart failure: volume overload, normal EF, but hypoxia. Active Problems:   History of pregnancy induced hypertension   Gestational diabetes mellitus in pregnancy, insulin controlled   Obesity   Sinus tachycardia   Hypoxia   Pre-eclampsia, delivered  After the relook Echo, it would appear less likely to be a case of Peri-partum CM & more likely pre-eclampsia/CS IVF volume related volume overload with diastolic HF.  HR & BP more stable - reassess after a few more BB doses Would convert to PO Lasix - 1 dose today, but start daily in AM. (~20mg  Lasix)  With mild wheeze - consider Xopenex Neb (is also c/o sinus congestion)  Would continue to progress care (OK to Txfr from ICU) -- would expect that she should be OK after ~1 more day to ensure stable volume status on PO diuretic & current BB dose.  Will check in while here.  Will help arrange OP F/U with Dr. Tenny Craw @ [facility] [address]. Office after d/c.    LOS: 1 day    Martha Morgan 08/19/2014, 5:55 PM

## 2014-08-19 NOTE — Progress Notes (Signed)
  Echocardiogram 2D Echocardiogram has been performed.  Leta Jungling M 08/19/2014, 2:48 PM

## 2014-08-19 NOTE — Progress Notes (Signed)
Pt transferred ambulatory to [facility] Rm #[id number] - will report to Stann Ore, RN

## 2014-08-19 NOTE — Progress Notes (Signed)
UR chart review completed.  

## 2014-08-20 LAB — BASIC METABOLIC PANEL
Anion gap: 12 (ref 5–15)
BUN: 15 mg/dL (ref 6–23)
CO2: 25 mEq/L (ref 19–32)
CREATININE: 0.8 mg/dL (ref 0.50–1.10)
Calcium: 8.4 mg/dL (ref 8.4–10.5)
Chloride: 100 mEq/L (ref 96–112)
Glucose, Bld: 91 mg/dL (ref 70–99)
Potassium: 4.4 mEq/L (ref 3.7–5.3)
Sodium: 137 mEq/L (ref 137–147)

## 2014-08-20 MED ORDER — FUROSEMIDE 20 MG PO TABS: ORAL_TABLET | ORAL | Status: DC

## 2014-08-20 MED ORDER — LEVALBUTEROL TARTRATE 45 MCG/ACT IN AERO: 2.0000 | INHALATION_SPRAY | RESPIRATORY_TRACT | Status: DC | PRN

## 2014-08-20 MED ORDER — METOPROLOL TARTRATE 25 MG PO TABS: 25.0000 mg | ORAL_TABLET | Freq: Two times a day (BID) | ORAL | Status: DC

## 2014-08-20 NOTE — Progress Notes (Signed)
   Subjective:  Feels much better today.  Slept well. Noted improvement after using nebulizer. BP & HR stable.  Objective:  Vital Signs in the last 24 hours: Temp:  [97.6 F (36.4 C)-99.1 F (37.3 C)] 98.2 F (36.8 C) (09/09 0534) Pulse Rate:  [66-107] 66 (09/09 0534) Resp:  [18-98] 98 (09/09 0534) BP: (118-157)/(57-105) 118/76 mmHg (09/09 0534) SpO2:  [93 %-100 %] 96 % (09/09 0534) Weight:  [217 lb 8 oz (98.657 kg)] 217 lb 8 oz (98.657 kg) (09/09 0534)  Intake/Output from previous day: 09/08 0701 - 09/09 0700 In: 997.2 [P.O.:980; I.V.:17.2] Out: 3625 [Urine:3625] Intake/Output from this shift:   Physical Exam: General appearance: alert, cooperative, appears stated age and no distress Neck: no adenopathy, no carotid bruit, no JVD and supple, symmetrical, trachea midline Lungs: CTAB, normal percussion bilaterally and except for late exp wheeze Heart: regular rate and rhythm, S1, S2 normal, no murmur, click, rub or gallop and normal apical impulse Abdomen: soft, non-tender; bowel sounds normal; no masses,  no organomegaly and expected post-op tenderness Extremities: extremities normal, atraumatic, no cyanosis or edema Pulses: 2+ and symmetric Neurologic: Grossly normal  Lab Results:  Recent Labs  08/18/14 0533  WBC 10.8*  HGB 7.7*  PLT 327    Recent Labs  08/19/14 0546 08/20/14 0523  NA 136* 137  K 4.1 4.4  CL 100 100  CO2 26 25  GLUCOSE 117* 91  BUN 11 15  CREATININE 0.89 0.80    Recent Labs  08/18/14 0533  TROPONINI <0.30  repeat ordered. Hepatic Function Panel  Recent Labs  08/18/14 0533  PROT 6.3  ALBUMIN 2.3*  AST 35  ALT 31  ALKPHOS 117  BILITOT 0.2*   Cardiac Studies: Echo with Definity - EF ~55-60% no regional WMA  Assessment/Plan:  Principal Problem:   Acute diastolic heart failure: volume overload, normal EF, but hypoxia. Active Problems:   History of pregnancy induced hypertension   Gestational diabetes mellitus in pregnancy,  insulin controlled   Obesity   Sinus tachycardia   Hypoxia   Pre-eclampsia, delivered  After the relook Echo, it would appear less likely to be a case of Peri-partum CM & more likely pre-eclampsia/CS IVF volume related volume overload with diastolic HF.  HR & BP more notably improved after a few more BB doses Continue with PO Lasix - (~20mg  Lasix) probably BID x ~2-3 days then reduce to daily until Cardiology f/u Anticipate potentially discharge this afternoon if continues to be stable  With mild wheeze - did better on Xopenex Neb (is also c/o sinus congestion)  Would continue to progress care (Potentially OK for d/c today or in AM from Cardiology perspective) -- seems OK from a volume status on PO diuretic & current BB dose.   Will check in while here.  Will help arrange OP F/U with Dr. Tenny Craw @ [facility]. Office after d/c if she lives locally - if closer for her - to go to [place] or [place] -  This can be arranged.  Contact Cardiac Consult service for assistance with ROV appt scheduling depending on preferred location for f/u.    LOS: 2 days    HARDING,DAVID W 08/20/2014, 7:10 AM

## 2014-08-20 NOTE — Discharge Instructions (Addendum)
Please go to ER with any concerning symptoms  Heart Failure Heart failure is a condition in which the heart has trouble pumping blood. This means your heart does not pump blood efficiently for your body to work well. In some cases of heart failure, fluid may back up into your lungs or you may have swelling (edema) in your lower legs. Heart failure is usually a long-term (chronic) condition. It is important for you to take good care of yourself and follow your health care provider's treatment plan. CAUSES  Some health conditions can cause heart failure. Those health conditions include:  High blood pressure (hypertension). Hypertension causes the heart muscle to work harder than normal. When pressure in the blood vessels is high, the heart needs to pump (contract) with more force in order to circulate blood throughout the body. High blood pressure eventually causes the heart to become stiff and weak.  Coronary artery disease (CAD). CAD is the buildup of cholesterol and fat (plaque) in the arteries of the heart. The blockage in the arteries deprives the heart muscle of oxygen and blood. This can cause chest pain and may lead to a heart attack. High blood pressure can also contribute to CAD.  Heart attack (myocardial infarction). A heart attack occurs when one or more arteries in the heart become blocked. The loss of oxygen damages the muscle tissue of the heart. When this happens, part of the heart muscle dies. The injured tissue does not contract as well and weakens the heart's ability to pump blood.  Abnormal heart valves. When the heart valves do not open and close properly, it can cause heart failure. This makes the heart muscle pump harder to keep the blood flowing.  Heart muscle disease (cardiomyopathy or myocarditis). Heart muscle disease is damage to the heart muscle from a variety of causes. These can include drug or alcohol abuse, infections, or unknown reasons. These can increase the risk of  heart failure.  Lung disease. Lung disease makes the heart work harder because the lungs do not work properly. This can cause a strain on the heart, leading it to fail.  Diabetes. Diabetes increases the risk of heart failure. High blood sugar contributes to high fat (lipid) levels in the blood. Diabetes can also cause slow damage to tiny blood vessels that carry important nutrients to the heart muscle. When the heart does not get enough oxygen and food, it can cause the heart to become weak and stiff. This leads to a heart that does not contract efficiently.  Other conditions can contribute to heart failure. These include abnormal heart rhythms, thyroid problems, and low blood counts (anemia). Certain unhealthy behaviors can increase the risk of heart failure, including:  Being overweight.  Smoking or chewing tobacco.  Eating foods high in fat and cholesterol.  Abusing illicit drugs or alcohol.  Lacking physical activity. SYMPTOMS  Heart failure symptoms may vary and can be hard to detect. Symptoms may include:  Shortness of breath with activity, such as climbing stairs.  Persistent cough.  Swelling of the feet, ankles, legs, or abdomen.  Unexplained weight gain.  Difficulty breathing when lying flat (orthopnea).  Waking from sleep because of the need to sit up and get more air.  Rapid heartbeat.  Fatigue and loss of energy.  Feeling light-headed, dizzy, or close to fainting.  Loss of appetite.  Nausea.  Increased urination during the night (nocturia). DIAGNOSIS  A diagnosis of heart failure is based on your history, symptoms, physical examination, and diagnostic  tests. Diagnostic tests for heart failure may include:  Echocardiography.  Electrocardiography.  Chest X-ray.  Blood tests.  Exercise stress test.  Cardiac angiography.  Radionuclide scans. TREATMENT  Treatment is aimed at managing the symptoms of heart failure. Medicines, behavioral changes, or  surgical intervention may be necessary to treat heart failure.  Medicines to help treat heart failure may include:  Angiotensin-converting enzyme (ACE) inhibitors. This type of medicine blocks the effects of a blood protein called angiotensin-converting enzyme. ACE inhibitors relax (dilate) the blood vessels and help lower blood pressure.  Angiotensin receptor blockers (ARBs). This type of medicine blocks the actions of a blood protein called angiotensin. Angiotensin receptor blockers dilate the blood vessels and help lower blood pressure.  Water pills (diuretics). Diuretics cause the kidneys to remove salt and water from the blood. The extra fluid is removed through urination. This loss of extra fluid lowers the volume of blood the heart pumps.  Beta blockers. These prevent the heart from beating too fast and improve heart muscle strength.  Digitalis. This increases the force of the heartbeat.  Healthy behavior changes include:  Obtaining and maintaining a healthy weight.  Stopping smoking or chewing tobacco.  Eating heart-healthy foods.  Limiting or avoiding alcohol.  Stopping illicit drug use.  Physical activity as directed by your health care provider.  Surgical treatment for heart failure may include:  A procedure to open blocked arteries, repair damaged heart valves, or remove damaged heart muscle tissue.  A pacemaker to improve heart muscle function and control certain abnormal heart rhythms.  An internal cardioverter defibrillator to treat certain serious abnormal heart rhythms.  A left ventricular assist device (LVAD) to assist the pumping ability of the heart. HOME CARE INSTRUCTIONS   Take medicines only as directed by your health care provider. Medicines are important in reducing the workload of your heart, slowing the progression of heart failure, and improving your symptoms.  Do not stop taking your medicine unless directed by your health care provider.  Do not  skip any dose of medicine.  Refill your prescriptions before you run out of medicine. Your medicines are needed every day.  Engage in moderate physical activity if directed by your health care provider. Moderate physical activity can benefit some people. The elderly and people with severe heart failure should consult with a health care provider for physical activity recommendations.  Eat heart-healthy foods. Food choices should be free of trans fat and low in saturated fat, cholesterol, and salt (sodium). Healthy choices include fresh or frozen fruits and vegetables, fish, lean meats, legumes, fat-free or low-fat dairy products, and whole grain or high fiber foods. Talk to a dietitian to learn more about heart-healthy foods.  Limit sodium if directed by your health care provider. Sodium restriction may reduce symptoms of heart failure in some people. Talk to a dietitian to learn more about heart-healthy seasonings.  Use healthy cooking methods. Healthy cooking methods include roasting, grilling, broiling, baking, poaching, steaming, or stir-frying. Talk to a dietitian to learn more about healthy cooking methods.  Limit fluids if directed by your health care provider. Fluid restriction may reduce symptoms of heart failure in some people.  Weigh yourself every day. Daily weights are important in the early recognition of excess fluid. You should weigh yourself every morning after you urinate and before you eat breakfast. Wear the same amount of clothing each time you weigh yourself. Record your daily weight. Provide your health care provider with your weight record.  Monitor and record  your blood pressure if directed by your health care provider.  Check your pulse if directed by your health care provider.  Lose weight if directed by your health care provider. Weight loss may reduce symptoms of heart failure in some people.  Stop smoking or chewing tobacco. Nicotine makes your heart work harder by  causing your blood vessels to constrict. Do not use nicotine gum or patches before talking to your health care provider.  Keep all follow-up visits as directed by your health care provider. This is important.  Limit alcohol intake to no more than 1 drink per day for nonpregnant women and 2 drinks per day for men. One drink equals 12 ounces of beer, 5 ounces of wine, or 1 ounces of hard liquor. Drinking more than that is harmful to your heart. Tell your health care provider if you drink alcohol several times a week. Talk with your health care provider about whether alcohol is safe for you. If your heart has already been damaged by alcohol or you have severe heart failure, drinking alcohol should be stopped completely.  Stop illicit drug use.  Stay up-to-date with immunizations. It is especially important to prevent respiratory infections through current pneumococcal and influenza immunizations.  Manage other health conditions such as hypertension, diabetes, thyroid disease, or abnormal heart rhythms as directed by your health care provider.  Learn to manage stress.  Plan rest periods when fatigued.  Learn strategies to manage high temperatures. If the weather is extremely hot:  Avoid vigorous physical activity.  Use air conditioning or fans or seek a cooler location.  Avoid caffeine and alcohol.  Wear loose-fitting, lightweight, and light-colored clothing.  Learn strategies to manage cold temperatures. If the weather is extremely cold:  Avoid vigorous physical activity.  Layer clothes.  Wear mittens or gloves, a hat, and a scarf when going outside.  Avoid alcohol.  Obtain ongoing education and support as needed.  Participate in or seek rehabilitation as needed to maintain or improve independence and quality of life. SEEK MEDICAL CARE IF:   Your weight increases by 03 lb/1.4 kg in 1 day or 05 lb/2.3 kg in a week.  You have increasing shortness of breath that is unusual for  you.  You are unable to participate in your usual physical activities.  You tire easily.  You cough more than normal, especially with physical activity.  You have any or more swelling in areas such as your hands, feet, ankles, or abdomen.  You are unable to sleep because it is hard to breathe.  You feel like your heart is beating fast (palpitations).  You become dizzy or light-headed upon standing up. SEEK IMMEDIATE MEDICAL CARE IF:   You have difficulty breathing.  There is a change in mental status such as decreased alertness or difficulty with concentration.  You have a pain or discomfort in your chest.  You have an episode of fainting (syncope). MAKE SURE YOU:   Understand these instructions.  Will watch your condition.  Will get help right away if you are not doing well or get worse. Document Released: 11/28/2005 Document Revised: 04/14/2014 Document Reviewed: 12/28/2012 [facility] Patient Information 2015 [place], [place]. This information is not intended to replace advice given to you by your health care provider. Make sure you discuss any questions you have with your health care provider.

## 2014-08-20 NOTE — Discharge Summary (Signed)
Physician Discharge Summary  Patient ID: Martha Morgan MRN: [id number] DOB/AGE: 1976/11/29 38 y.o.  Admit date: 08/18/2014 Discharge date: 08/20/2014  Principal Problem:   Acute diastolic heart failure: volume overload, normal EF, but hypoxia.  Active Problems:   History of pregnancy induced hypertension   Gestational diabetes mellitus in pregnancy, insulin controlled   Obesity   Sinus tachycardia   Hypoxia   Pre-eclampsia, delivered  Discharge Diagnosis: Pre-eclampsia/cesarean section IVF volume related volume overload with diastolic heart failure.   Discharged Condition: Stable  Hospital Course: Martha Morgan is a 38 y.o. female [phone] s/p LTCS at [redacted]w[redacted]d on 08/13/14 who presented as a direct admit on 08/18/14 from [facility] with SOB, cough, and lower extremity edema with elevated BPs (170-130s/100-70s). The patient had a c-section on 08/13/14 due to pre-eclampsia and class B diabetes, non-compliant with medication.  She stated that her postpartum stay at [facility] was uneventful.  She was doing well until approximately 11pm on 08/17/14 when she noted SOB, nasal congestion, and coughing which has worsened gradually. She denied any pleuritic chest pain.  She went to [facility] ER, BNP was increased at 471 and CT was negative for PE but showed small bilateral effusions. She was started on magnesium sulfate eclampsia prophylaxis and was transferred to [facility], she was admitted to [facility] and was given Lasix for aggressive diuresis; she had  >5L UOP.  Magnesium sulfate infusion was discontinued. Cardiology was consulted, ECHO showed EF ~55-60% no regional WMA.  Their impression was that it was less likely to be a case of peri-partum cardiomyopathy & more likely pre-eclampsia/cesarean section IVF volume related volume overload with diastolic heart failure.  The Lasix was continued, she was also started on a beta blocker (Metoprolol 25 mg bid). Xopenex also helped her respiratory symptoms.  Her symptoms  improved, and she was deemed stable to go home and have outpatient follow up.  Consults: Cardiology (Dr. Glenetta Hew)  Treatments: Aggressive Lasix diuresis, Metoprolol, Xopenex  Significant Diagnostic Studies:  Results for orders placed during the hospital encounter of 08/18/14 (from the past 72 hour(s))  CBC WITH DIFFERENTIAL     Status: Abnormal   Collection Time    08/18/14  5:33 AM      Result Value Ref Range   WBC 10.8 (*) 4.0 - 10.5 K/uL   RBC 3.02 (*) 3.87 - 5.11 MIL/uL   Hemoglobin 7.7 (*) 12.0 - 15.0 g/dL   HCT 24.6 (*) 36.0 - 46.0 %   MCV 81.5  78.0 - 100.0 fL   MCH 25.5 (*) 26.0 - 34.0 pg   MCHC 31.3  30.0 - 36.0 g/dL   RDW 14.9  11.5 - 15.5 %   Platelets 327  150 - 400 K/uL   Neutrophils Relative % 84 (*) 43 - 77 %   Neutro Abs 9.0 (*) 1.7 - 7.7 K/uL   Lymphocytes Relative 9 (*) 12 - 46 %   Lymphs Abs 1.0  0.7 - 4.0 K/uL   Monocytes Relative 5  3 - 12 %   Monocytes Absolute 0.5  0.1 - 1.0 K/uL   Eosinophils Relative 2  0 - 5 %   Eosinophils Absolute 0.2  0.0 - 0.7 K/uL   Basophils Relative 0  0 - 1 %   Basophils Absolute 0.0  0.0 - 0.1 K/uL  COMPREHENSIVE METABOLIC PANEL     Status: Abnormal   Collection Time    08/18/14  5:33 AM      Result Value Ref  Range   Sodium 140  137 - 147 mEq/L   Potassium 4.3  3.7 - 5.3 mEq/L   Chloride 103  96 - 112 mEq/L   CO2 26  19 - 32 mEq/L   Glucose, Bld 138 (*) 70 - 99 mg/dL   BUN 9  6 - 23 mg/dL   Creatinine, Ser 0.77  0.50 - 1.10 mg/dL   Calcium 8.3 (*) 8.4 - 10.5 mg/dL   Total Protein 6.3  6.0 - 8.3 g/dL   Albumin 2.3 (*) 3.5 - 5.2 g/dL   AST 35  0 - 37 U/L   ALT 31  0 - 35 U/L   Alkaline Phosphatase 117  39 - 117 U/L   Total Bilirubin 0.2 (*) 0.3 - 1.2 mg/dL   GFR calc non Af Amer >90  >90 mL/min   GFR calc Af Amer >90  >90 mL/min   Comment: (NOTE)     The eGFR has been calculated using the CKD EPI equation.     This calculation has not been validated in all clinical situations.     eGFR's persistently <90  mL/min signify possible Chronic Kidney     Disease.   Anion gap 11  5 - 15  PRO B NATRIURETIC PEPTIDE     Status: Abnormal   Collection Time    08/18/14  5:33 AM      Result Value Ref Range   Pro B Natriuretic peptide (BNP) 471.9 (*) 0 - 125 pg/mL  TROPONIN I     Status: None   Collection Time    08/18/14  5:33 AM      Result Value Ref Range   Troponin I <0.30  <0.30 ng/mL   Comment:            Due to the release kinetics of cTnI,     a negative result within the first hours     of the onset of symptoms does not rule out     myocardial infarction with certainty.     If myocardial infarction is still suspected,     repeat the test at appropriate intervals.  MAGNESIUM     Status: None   Collection Time    08/18/14  5:33 AM      Result Value Ref Range   Magnesium 1.9  1.5 - 2.5 mg/dL  PROTIME-INR     Status: None   Collection Time    08/18/14  5:40 AM      Result Value Ref Range   Prothrombin Time 12.1  11.6 - 15.2 seconds   INR 0.89  0.00 - 1.49  BLOOD GAS, ARTERIAL     Status: Abnormal   Collection Time    08/18/14  7:50 AM      Result Value Ref Range   O2 Content 2.0     Delivery systems NASAL CANNULA     pH, Arterial 7.470 (*) 7.350 - 7.450   pCO2 arterial 34.3 (*) 35.0 - 45.0 mmHg   pO2, Arterial 74.9 (*) 80.0 - 100.0 mmHg   Bicarbonate 24.6 (*) 20.0 - 24.0 mEq/L   TCO2 23.2  0 - 100 mmol/L   Acid-Base Excess 1.2  0.0 - 2.0 mmol/L   O2 Saturation 95.1     Patient temperature 37.0     Collection site RIGHT RADIAL     Drawn by [id number]     Sample type ARTERIAL DRAW     Allens test (pass/fail) PASS  PASS  URINALYSIS, ROUTINE W  REFLEX MICROSCOPIC     Status: Abnormal   Collection Time    08/18/14  9:36 AM      Result Value Ref Range   Color, Urine ORANGE (*) YELLOW   Comment: BIOCHEMICALS MAY BE AFFECTED BY COLOR   APPearance HAZY (*) CLEAR   Specific Gravity, Urine <1.005 (*) 1.005 - 1.030   pH 6.5  5.0 - 8.0   Glucose, UA NEGATIVE  NEGATIVE mg/dL   Hgb urine  dipstick LARGE (*) NEGATIVE   Bilirubin Urine NEGATIVE  NEGATIVE   Ketones, ur NEGATIVE  NEGATIVE mg/dL   Protein, ur 30 (*) NEGATIVE mg/dL   Urobilinogen, UA 0.2  0.0 - 1.0 mg/dL   Nitrite NEGATIVE  NEGATIVE   Leukocytes, UA TRACE (*) NEGATIVE  PROTEIN / CREATININE RATIO, URINE     Status: Abnormal   Collection Time    08/18/14  9:36 AM      Result Value Ref Range   Creatinine, Urine 47.30     Total Protein, Urine 32     Comment: NO NORMAL RANGE ESTABLISHED FOR THIS TEST   PROTEIN CREATININE RATIO 0.68 (*) 0.00 - 0.15  URINE MICROSCOPIC-ADD ON     Status: Abnormal   Collection Time    08/18/14  9:36 AM      Result Value Ref Range   WBC, UA 3-6  <3 WBC/hpf   RBC / HPF TOO NUMEROUS TO COUNT  <3 RBC/hpf   Bacteria, UA FEW (*) RARE  GLUCOSE, CAPILLARY     Status: None   Collection Time    08/18/14 12:24 PM      Result Value Ref Range   Glucose-Capillary 98  70 - 99 mg/dL  MRSA PCR SCREENING     Status: None   Collection Time    08/18/14  1:05 PM      Result Value Ref Range   MRSA by PCR NEGATIVE  NEGATIVE   Comment:            The GeneXpert MRSA Assay (FDA     approved for NASAL specimens     only), is one component of a     comprehensive MRSA colonization     surveillance program. It is not     intended to diagnose MRSA     infection nor to guide or     monitor treatment for     MRSA infections.  TSH     Status: None   Collection Time    08/18/14  2:35 PM      Result Value Ref Range   TSH 0.975  0.350 - 4.500 uIU/mL   Comment: Performed at [facility]  GLUCOSE, CAPILLARY     Status: Abnormal   Collection Time    08/18/14  6:07 PM      Result Value Ref Range   Glucose-Capillary 100 (*) 70 - 99 mg/dL  GLUCOSE, CAPILLARY     Status: None   Collection Time    08/18/14 10:13 PM      Result Value Ref Range   Glucose-Capillary 86  70 - 99 mg/dL  BASIC METABOLIC PANEL     Status: Abnormal   Collection Time    08/19/14  5:46 AM      Result Value Ref Range    Sodium 136 (*) 137 - 147 mEq/L   Potassium 4.1  3.7 - 5.3 mEq/L   Chloride 100  96 - 112 mEq/L   CO2 26  19 - 32 mEq/L  Glucose, Bld 117 (*) 70 - 99 mg/dL   BUN 11  6 - 23 mg/dL   Creatinine, Ser 7.91  0.50 - 1.10 mg/dL   Calcium 7.6 (*) 8.4 - 10.5 mg/dL   GFR calc non Af Amer 80 (*) >90 mL/min   GFR calc Af Amer >90  >90 mL/min   Comment: (NOTE)     The eGFR has been calculated using the CKD EPI equation.     This calculation has not been validated in all clinical situations.     eGFR's persistently <90 mL/min signify possible Chronic Kidney     Disease.   Anion gap 10  5 - 15  PRO B NATRIURETIC PEPTIDE     Status: Abnormal   Collection Time    08/19/14  5:46 AM      Result Value Ref Range   Pro B Natriuretic peptide (BNP) 814.2 (*) 0 - 125 pg/mL   Comment: Performed at [facility]  GLUCOSE, CAPILLARY     Status: Abnormal   Collection Time    08/19/14  7:53 AM      Result Value Ref Range   Glucose-Capillary 107 (*) 70 - 99 mg/dL  BASIC METABOLIC PANEL     Status: None   Collection Time    08/20/14  5:23 AM      Result Value Ref Range   Sodium 137  137 - 147 mEq/L   Potassium 4.4  3.7 - 5.3 mEq/L   Chloride 100  96 - 112 mEq/L   CO2 25  19 - 32 mEq/L   Glucose, Bld 91  70 - 99 mg/dL   BUN 15  6 - 23 mg/dL   Creatinine, Ser 9.95  0.50 - 1.10 mg/dL   Calcium 8.4  8.4 - 79.0 mg/dL   GFR calc non Af Amer >90  >90 mL/min   GFR calc Af Amer >90  >90 mL/min   Comment: (NOTE)     The eGFR has been calculated using the CKD EPI equation.     This calculation has not been validated in all clinical situations.     eGFR's persistently <90 mL/min signify possible Chronic Kidney     Disease.   Anion gap 12  5 - 15   08/19/2014 2D ECHO Study Conclusions Left ventricle: The cavity size was normal. Systolic function was normal. The estimated ejection fraction was in the range of 55% to 60%. Wall motion was normal; there were no regional wallmotion abnormalities.  08/18/2014   CT  ANGIOGRAPHY CHEST WITH CONTRAST CLINICAL DATA:  Shortness of breath.  C-section 8 days ago.  TECHNIQUE: Multidetector CT imaging of the chest was performed using the standard protocol during bolus administration of intravenous contrast. Multiplanar CT image reconstructions and MIPs were obtained to evaluate the vascular anatomy.  CONTRAST:  OMNIPAQUE IOHEXOL 350 MG/ML SOLN  COMPARISON:  None.  FINDINGS: Technically adequate study with moderately good opacification of the central and segmental pulmonary arteries. No focal filling defects. No evidence of significant pulmonary embolus.  Normal heart size. Normal caliber thoracic aorta. No significant lymphadenopathy in the chest. Esophagus is decompressed.  Small bilateral pleural effusions with atelectasis in the lung bases. No focal consolidation. No pneumothorax. Airways appear patent with mild diffuse airways thickening suggesting bronchitic changes.  Included portions of the upper abdominal organs demonstrate no focal abnormality. Mild degenerative changes in the spine. No destructive bone lesions.  Review of the MIP images confirms the above findings.  IMPRESSION: No  evidence of significant pulmonary embolus. Small bilateral pleural effusions with basilar atelectasis.   Electronically Signed   By: Lucienne Capers M.D.   On: 08/18/2014 06:43   08/18/2014   PORTABLE CHEST - 1 VIEW  CLINICAL DATA:  Shortness of breath last night. C-section on September 2.    COMPARISON:  None.  FINDINGS: Shallow inspiration. Heart size and pulmonary vascularity are normal. Soft tissue attenuation in the lung bases. No focal airspace disease or consolidation is suggested. No pneumothorax. No blunting of costophrenic angles.  IMPRESSION: No active disease.   Electronically Signed   By: Lucienne Capers M.D.   On: 08/18/2014 05:55   Discharge Exam: Blood pressure 118/76, pulse 66, temperature 98.2 F (36.8 C), temperature source Oral, resp. rate 98, height $RemoveBe'5\' 6"'gNABDPeQy$  (1.676 m),  weight 217 lb 8 oz (98.657 kg), SpO2 96.00%.  Intake/Output from previous day:  09/08 0701 - 09/09 0700  In: 997.2 [P.O.:980; I.V.:17.2]  Out: 3625 [Urine:3625]  General appearance: alert, cooperative, appears stated age and no distress  Neck: no adenopathy, no carotid bruit, no JVD and supple, symmetrical, trachea midline  Lungs: CTAB, normal percussion bilaterally and except for late expiratory wheezing  Heart: regular rate and rhythm, S1, S2 normal, no murmur, click, rub or gallop and normal apical impulse  Abdomen: soft, non-tender; bowel sounds normal; no masses, no organomegaly and expected post-op tenderness. Incision C/D/I.  Extremities: extremities normal, atraumatic, no cyanosis or edema  Pulses: 2+ and symmetric  Neurologic: Grossly normal  Disposition: 01-Home or Self Care     Medication List         docusate sodium 100 MG capsule  Commonly known as:  COLACE  Take 100 mg by mouth 2 (two) times daily.     furosemide 20 MG tablet  Commonly known as:  LASIX  Take one tablet twice daily for 4 days, then once daily until seen by cardiologist     glyBURIDE 5 MG tablet  Commonly known as:  DIABETA  Take 5 mg by mouth daily with breakfast.     ibuprofen 800 MG tablet  Commonly known as:  ADVIL,MOTRIN  Take 800 mg by mouth every 8 (eight) hours as needed for moderate pain.     levalbuterol 45 MCG/ACT inhaler  Commonly known as:  XOPENEX HFA  Inhale 2 puffs into the lungs every 4 (four) hours as needed for wheezing.     metoprolol tartrate 25 MG tablet  Commonly known as:  LOPRESSOR  Take 1 tablet (25 mg total) by mouth 2 (two) times daily.     oxyCODONE 5 MG immediate release tablet  Commonly known as:  Oxy IR/ROXICODONE  Take 5-10 mg by mouth every 4 (four) hours as needed for severe pain.         SignedOsborne Oman, MD 08/20/2014, 12:01 PM

## 2014-08-20 NOTE — Progress Notes (Signed)
Discharge instructions provided to patient and family at bedside.  Activity, medications, follow up appointments, when to call the doctor and community resources discussed.  No questions at this time.  Patient left unit in stable condition with all personal belongings accompanied by staff.  Osvaldo Angst, RN------------------------------

## 2014-10-13 ENCOUNTER — Encounter (HOSPITAL_COMMUNITY): Payer: Self-pay | Admitting: Emergency Medicine

## 2016-02-24 ENCOUNTER — Emergency Department (HOSPITAL_COMMUNITY): Payer: BLUE CROSS/BLUE SHIELD

## 2016-02-24 ENCOUNTER — Encounter (HOSPITAL_COMMUNITY): Payer: Self-pay | Admitting: Emergency Medicine

## 2016-02-24 ENCOUNTER — Emergency Department (HOSPITAL_COMMUNITY)
Admission: EM | Admit: 2016-02-24 | Discharge: 2016-02-25 | Disposition: A | Discharge status: Home / Self Care | Payer: BLUE CROSS/BLUE SHIELD | Attending: Emergency Medicine | Admitting: Emergency Medicine

## 2016-02-24 DIAGNOSIS — E669 Obesity, unspecified: Secondary | ICD-10-CM | POA: Diagnosis not present

## 2016-02-24 DIAGNOSIS — Z87891 Personal history of nicotine dependence: Secondary | ICD-10-CM | POA: Diagnosis not present

## 2016-02-24 DIAGNOSIS — R079 Chest pain, unspecified: Secondary | ICD-10-CM | POA: Diagnosis not present

## 2016-02-24 DIAGNOSIS — M549 Dorsalgia, unspecified: Secondary | ICD-10-CM | POA: Insufficient documentation

## 2016-02-24 DIAGNOSIS — N200 Calculus of kidney: Secondary | ICD-10-CM | POA: Diagnosis not present

## 2016-02-24 DIAGNOSIS — R109 Unspecified abdominal pain: Secondary | ICD-10-CM | POA: Diagnosis present

## 2016-02-24 LAB — URINALYSIS, ROUTINE W REFLEX MICROSCOPIC
BILIRUBIN URINE: NEGATIVE
Glucose, UA: NEGATIVE mg/dL
NITRITE: POSITIVE — AB
PH: 6 (ref 5.0–8.0)
Protein, ur: 30 mg/dL — AB
Specific Gravity, Urine: 1.02 (ref 1.005–1.030)

## 2016-02-24 LAB — URINE MICROSCOPIC-ADD ON

## 2016-02-24 LAB — CBC
HEMATOCRIT: 41.9 % (ref 36.0–46.0)
Hemoglobin: 13.6 g/dL (ref 12.0–15.0)
MCH: 26.2 pg (ref 26.0–34.0)
MCHC: 32.5 g/dL (ref 30.0–36.0)
MCV: 80.7 fL (ref 78.0–100.0)
Platelets: 329 10*3/uL (ref 150–400)
RBC: 5.19 MIL/uL — ABNORMAL HIGH (ref 3.87–5.11)
RDW: 13.6 % (ref 11.5–15.5)
WBC: 14.5 10*3/uL — ABNORMAL HIGH (ref 4.0–10.5)

## 2016-02-24 LAB — BASIC METABOLIC PANEL
Anion gap: 8 (ref 5–15)
BUN: 13 mg/dL (ref 6–20)
CHLORIDE: 100 mmol/L — AB (ref 101–111)
CO2: 28 mmol/L (ref 22–32)
Calcium: 9.1 mg/dL (ref 8.9–10.3)
Creatinine, Ser: 1 mg/dL (ref 0.44–1.00)
GFR calc Af Amer: >60 mL/min (ref >60)
GFR calc non Af Amer: >60 mL/min (ref >60)
Glucose, Bld: 192 mg/dL — ABNORMAL HIGH (ref 65–99)
POTASSIUM: 4 mmol/L (ref 3.5–5.1)
Sodium: 136 mmol/L (ref 135–145)

## 2016-02-24 LAB — PREGNANCY, URINE: Preg Test, Ur: NEGATIVE

## 2016-02-24 MED ORDER — ONDANSETRON HCL 8 MG PO TABS: 8.0000 mg | ORAL_TABLET | ORAL | Status: DC | PRN

## 2016-02-24 MED ORDER — OXYCODONE-ACETAMINOPHEN 5-325 MG PO TABS: 1.0000 | ORAL_TABLET | ORAL | Status: DC | PRN

## 2016-02-24 MED ORDER — KETOROLAC TROMETHAMINE 30 MG/ML IJ SOLN
30.0000 mg | Freq: Once | INTRAMUSCULAR | Status: AC
  Administered 2016-02-24: 30 mg via INTRAVENOUS
  Filled 2016-02-24: qty 1

## 2016-02-24 MED ORDER — HYDROMORPHONE HCL 1 MG/ML IJ SOLN
1.0000 mg | Freq: Once | INTRAMUSCULAR | Status: AC
  Administered 2016-02-25: 1 mg via INTRAVENOUS
  Filled 2016-02-24: qty 1

## 2016-02-24 MED ORDER — ONDANSETRON HCL 4 MG/2ML IJ SOLN
4.0000 mg | Freq: Once | INTRAMUSCULAR | Status: AC
  Administered 2016-02-24: 4 mg via INTRAVENOUS
  Filled 2016-02-24: qty 2

## 2016-02-24 MED ORDER — SODIUM CHLORIDE 0.9 % IV BOLUS (SEPSIS)
500.0000 mL | Freq: Once | INTRAVENOUS | Status: AC
  Administered 2016-02-24: 500 mL via INTRAVENOUS

## 2016-02-24 MED ORDER — TAMSULOSIN HCL 0.4 MG PO CAPS: 0.4000 mg | ORAL_CAPSULE | Freq: Every day | ORAL | Status: DC

## 2016-02-24 MED ORDER — MORPHINE SULFATE (PF) 4 MG/ML IV SOLN
4.0000 mg | Freq: Once | INTRAVENOUS | Status: AC
  Administered 2016-02-24: 4 mg via INTRAVENOUS
  Filled 2016-02-24: qty 1

## 2016-02-24 NOTE — ED Notes (Signed)
Pt c/o back pain that radiates into chest since noon today.

## 2016-02-24 NOTE — ED Notes (Signed)
Radiology requests a pregnancy test prior to CT

## 2016-02-24 NOTE — ED Notes (Signed)
PT REPORTS l SIDED FLANK PAIN AND INABILITY TO VOID SINE NOON-

## 2016-02-24 NOTE — ED Provider Notes (Signed)
CSN: [id number]     Arrival date & time 02/24/16  2030 History  By signing my name below, I, Linus GalasMaharshi Patel, attest that this documentation has been prepared under the direction and in the presence of No att. providers found. Electronically Signed: Linus GalasMaharshi Patel, ED Scribe. 02/27/2016. 9:20 PM.   Chief Complaint  Patient presents with  . Chest Pain   The history is provided by the patient. No language interpreter was used.   HPI Comments: [name] is a 40 y.o. female who presents to the Emergency Department with PMHx of kidney stones complaining of a sudden onset of left flank pain that radiates to her abdomen beginning at noon, 9 hours ago today. Pt also notes inability to void urine. She notes one previous occurrence of kidney stones and associates these symptoms with it.. Pt denies any vomiting, or any other symptoms at this time.  Severity of pain is moderate to severe.  Past Medical History  Diagnosis Date  . History of pregnancy induced hypertension   . Obesity   . Gestational diabetes mellitus in pregnancy, insulin controlled    Past Surgical History  Procedure Laterality Date  . Rectovaginal fistula closure    . Cesarean section     History reviewed. No pertinent family history. Social History  Substance Use Topics  . Smoking status: Former Games developermoker  . Smokeless tobacco: None  . Alcohol Use: No   OB History    Gravida Para Term Preterm AB TAB SAB Ectopic Multiple Living   4 2   2     2       Obstetric Comments   [facility][birth year]     Review of Systems A complete 10 system review of systems was obtained and all systems are negative except as noted in the HPI and PMH.   Allergies  Review of patient's allergies indicates no known allergies.  Home Medications   Prior to Admission medications   Medication Sig Start Date End Date Taking? Authorizing Provider  ibuprofen (ADVIL,MOTRIN) 200 MG tablet Take 400 mg by mouth every 6 (six) hours as  needed for moderate pain.   Yes Historical Provider, MD  ondansetron (ZOFRAN) 8 MG tablet Take 1 tablet (8 mg total) by mouth every 4 (four) hours as needed. 02/24/16   Donnetta HutchingBrian Sota Hetz, MD  oxyCODONE-acetaminophen (PERCOCET) 5-325 MG tablet Take 1-2 tablets by mouth every 4 (four) hours as needed. 02/24/16   Donnetta HutchingBrian Ignazio Kincaid, MD  tamsulosin (FLOMAX) 0.4 MG CAPS capsule Take 1 capsule (0.4 mg total) by mouth daily. 02/24/16   Donnetta HutchingBrian Willma Obando, MD   BP 123/98 mmHg  Pulse 62  Temp(Src) 98.2 F (36.8 C) (Oral)  Resp 20  Ht 5\' 6"  (1.676 m)  Wt 225 lb (102.059 kg)  BMI 36.33 kg/m2  SpO2 97%  LMP 02/10/2016   Physical Exam  Constitutional: She is oriented to person, place, and time. She appears well-developed and well-nourished.  HENT:  Head: Normocephalic and atraumatic.  Eyes: Conjunctivae and EOM are normal. Pupils are equal, round, and reactive to light.  Neck: Normal range of motion. Neck supple.  Cardiovascular: Normal rate and regular rhythm.   Pulmonary/Chest: Effort normal and breath sounds normal.  Abdominal: Soft. Bowel sounds are normal.  Left flank tenderness radiating to lateral abdomen  Musculoskeletal: Normal range of motion.  Neurological: She is alert and oriented to person, place, and time.  Skin: Skin is warm and dry.  Psychiatric: She has a normal mood and affect. Her behavior  is normal.  Nursing note and vitals reviewed.  ED Course  Procedures   DIAGNOSTIC STUDIES: Oxygen Saturation is 99% on room air, normal by my interpretation.    COORDINATION OF CARE: 9:16 PM Will give pain medication and zofran. Will order CT renal study. Discussed treatment plan with pt at bedside and pt agreed to plan.  Labs Review Labs Reviewed  BASIC METABOLIC PANEL - Abnormal; Notable for the following:    Chloride 100 (*)    Glucose, Bld 192 (*)    All other components within normal limits  CBC - Abnormal; Notable for the following:    WBC 14.5 (*)    RBC 5.19 (*)    All other components within  normal limits  URINALYSIS, ROUTINE W REFLEX MICROSCOPIC (NOT AT [facility]) - Abnormal; Notable for the following:    APPearance HAZY (*)    Hgb urine dipstick SMALL (*)    Ketones, ur TRACE (*)    Protein, ur 30 (*)    Nitrite POSITIVE (*)    Leukocytes, UA SMALL (*)    All other components within normal limits  URINE MICROSCOPIC-ADD ON - Abnormal; Notable for the following:    Squamous Epithelial / LPF TOO NUMEROUS TO COUNT (*)    Bacteria, UA MANY (*)    All other components within normal limits  PREGNANCY, URINE    Imaging Review No results found. I have personally reviewed and evaluated these images and lab results as part of my medical decision-making.   EKG Interpretation   Date/Time:  Wednesday February 24 2016 20:39:19 EDT Ventricular Rate:  100 PR Interval:  146 QRS Duration: 84 QT Interval:  352 QTC Calculation: 454 R Axis:   66 Text Interpretation:  Normal sinus rhythm Normal ECG ED PHYSICIAN  INTERPRETATION AVAILABLE IN [facility] Confirmed by TEST, Record  ([id number]) on 02/25/2016 7:42:18 AM      MDM   Final diagnoses:  Left flank pain  Kidney stone on left side    CT renal of left side shows a 6 x 4 mm stone in the proximal left ureter. Pain management successfully obtained in the ED. She'll follow-up with urology. Discharge medications Percocet, Flomax 0.4 mg, Zofran 8 mg.  I personally performed the services described in this documentation, which was scribed in my presence. The recorded information has been reviewed and is accurate.     Donnetta Hutching, MD 02/27/16 [phone]

## 2016-02-24 NOTE — ED Notes (Signed)
To CT via stretcher

## 2016-02-24 NOTE — Discharge Instructions (Signed)
You have a large kidney stone on your left side. Medication for pain, nausea, and to increase urinary flow. Call urology office in the morning for follow-up.

## 2016-03-15 ENCOUNTER — Emergency Department (HOSPITAL_COMMUNITY): Payer: BLUE CROSS/BLUE SHIELD

## 2016-03-15 ENCOUNTER — Emergency Department (HOSPITAL_COMMUNITY)
Admission: EM | Admit: 2016-03-15 | Discharge: 2016-03-15 | Disposition: A | Discharge status: Home / Self Care | Payer: BLUE CROSS/BLUE SHIELD | Attending: Emergency Medicine | Admitting: Emergency Medicine

## 2016-03-15 ENCOUNTER — Encounter (HOSPITAL_COMMUNITY): Payer: Self-pay | Admitting: Emergency Medicine

## 2016-03-15 DIAGNOSIS — Z87891 Personal history of nicotine dependence: Secondary | ICD-10-CM | POA: Insufficient documentation

## 2016-03-15 DIAGNOSIS — N23 Unspecified renal colic: Secondary | ICD-10-CM | POA: Insufficient documentation

## 2016-03-15 DIAGNOSIS — R109 Unspecified abdominal pain: Secondary | ICD-10-CM | POA: Diagnosis present

## 2016-03-15 DIAGNOSIS — E669 Obesity, unspecified: Secondary | ICD-10-CM | POA: Insufficient documentation

## 2016-03-15 LAB — CBC WITH DIFFERENTIAL/PLATELET
BASOS ABS: 0 10*3/uL (ref 0.0–0.1)
BASOS PCT: 0 %
EOS ABS: 0.1 10*3/uL (ref 0.0–0.7)
EOS PCT: 0 %
HEMATOCRIT: 36.1 % (ref 36.0–46.0)
Hemoglobin: 11.6 g/dL — ABNORMAL LOW (ref 12.0–15.0)
LYMPHS ABS: 0.9 10*3/uL (ref 0.7–4.0)
Lymphocytes Relative: 8 %
MCH: 25.8 pg — ABNORMAL LOW (ref 26.0–34.0)
MCHC: 32.1 g/dL (ref 30.0–36.0)
MCV: 80.2 fL (ref 78.0–100.0)
Monocytes Absolute: 0.7 10*3/uL (ref 0.1–1.0)
Monocytes Relative: 6 %
Neutro Abs: 10 10*3/uL — ABNORMAL HIGH (ref 1.7–7.7)
Neutrophils Relative %: 86 %
PLATELETS: 314 10*3/uL (ref 150–400)
RBC: 4.5 MIL/uL (ref 3.87–5.11)
RDW: 14 % (ref 11.5–15.5)
WBC: 11.6 10*3/uL — AB (ref 4.0–10.5)

## 2016-03-15 LAB — PREGNANCY, URINE: Preg Test, Ur: NEGATIVE

## 2016-03-15 LAB — BASIC METABOLIC PANEL
Anion gap: 9 (ref 5–15)
BUN: 15 mg/dL (ref 6–20)
CALCIUM: 8.8 mg/dL — AB (ref 8.9–10.3)
CO2: 26 mmol/L (ref 22–32)
CREATININE: 0.91 mg/dL (ref 0.44–1.00)
Chloride: 103 mmol/L (ref 101–111)
GFR calc Af Amer: >60 mL/min (ref >60)
GLUCOSE: 286 mg/dL — AB (ref 65–99)
Potassium: 4.3 mmol/L (ref 3.5–5.1)
Sodium: 138 mmol/L (ref 135–145)

## 2016-03-15 LAB — URINALYSIS, ROUTINE W REFLEX MICROSCOPIC
Bilirubin Urine: NEGATIVE
GLUCOSE, UA: NEGATIVE mg/dL
Ketones, ur: NEGATIVE mg/dL
NITRITE: POSITIVE — AB
PH: 6 (ref 5.0–8.0)
Protein, ur: NEGATIVE mg/dL
SPECIFIC GRAVITY, URINE: 1.01 (ref 1.005–1.030)

## 2016-03-15 LAB — URINE MICROSCOPIC-ADD ON

## 2016-03-15 MED ORDER — SULFAMETHOXAZOLE-TRIMETHOPRIM 800-160 MG PO TABS: 1.0000 | ORAL_TABLET | Freq: Two times a day (BID) | ORAL | Status: AC

## 2016-03-15 MED ORDER — OXYCODONE-ACETAMINOPHEN 5-325 MG PO TABS
1.0000 | ORAL_TABLET | ORAL | Status: DC | PRN
Start: 2016-03-15 — End: 2016-11-20

## 2016-03-15 MED ORDER — METOCLOPRAMIDE HCL 10 MG PO TABS: 10.0000 mg | ORAL_TABLET | Freq: Four times a day (QID) | ORAL | Status: DC | PRN

## 2016-03-15 MED ORDER — METOCLOPRAMIDE HCL 5 MG/ML IJ SOLN
10.0000 mg | Freq: Once | INTRAMUSCULAR | Status: AC
  Administered 2016-03-15: 10 mg via INTRAVENOUS
  Filled 2016-03-15: qty 2

## 2016-03-15 MED ORDER — SULFAMETHOXAZOLE-TRIMETHOPRIM 800-160 MG PO TABS
1.0000 | ORAL_TABLET | Freq: Once | ORAL | Status: AC
  Administered 2016-03-15: 1 via ORAL
  Filled 2016-03-15: qty 1

## 2016-03-15 MED ORDER — HYDROMORPHONE HCL 1 MG/ML IJ SOLN
1.0000 mg | Freq: Once | INTRAMUSCULAR | Status: AC
  Administered 2016-03-15: 1 mg via INTRAVENOUS
  Filled 2016-03-15: qty 1

## 2016-03-15 NOTE — ED Provider Notes (Signed)
CSN: [id number]     Arrival date & time 03/15/16  4098 History   First MD Initiated Contact with Patient 03/15/16 0725     Chief Complaint  Patient presents with  . Flank Pain     (Consider location/radiation/quality/duration/timing/severity/associated sxs/prior Treatment) HPI Complains of right-sided flank pain rating to right groin onset 1 AM today similar to kidney stone pain she had approximately 2 weeks ago. Symptoms accompanied by vomiting. She last vomited immediately prior to coming to the emergency department. Last normal menstrual period ended last week. She denies fever. She treated herself with Flomax and Zofran from  prior prescriptions. No other associated symptoms. Pain is severe. She was seen here on 02/24/2016 for ureteral colic headaches 6.4 mm ureteral stone on left side, with a company hydronephrosis and a right sided stone in the kidney nonobstructing Nothing makes symptoms better or worse Past Medical History  Diagnosis Date  . History of pregnancy induced hypertension   . Obesity   . Gestational diabetes mellitus in pregnancy, insulin controlled    Past Surgical History  Procedure Laterality Date  . Rectovaginal fistula closure    . Cesarean section     History reviewed. No pertinent family history. Social History  Substance Use Topics  . Smoking status: Former Games developer  . Smokeless tobacco: None  . Alcohol Use: No   OB History    Gravida Para Term Preterm AB TAB SAB Ectopic Multiple Living   4 2   2     2       Obstetric Comments   Delivery at [facility] [birth year]     Review of Systems  Gastrointestinal: Positive for nausea and vomiting.  Genitourinary: Positive for flank pain.  All other systems reviewed and are negative.     Allergies  Review of patient's allergies indicates no known allergies.  Home Medications   Prior to Admission medications   Medication Sig Start Date End Date Taking? Authorizing Provider  ibuprofen  (ADVIL,MOTRIN) 200 MG tablet Take 400 mg by mouth every 6 (six) hours as needed for moderate pain.    Historical Provider, MD  ondansetron (ZOFRAN) 8 MG tablet Take 1 tablet (8 mg total) by mouth every 4 (four) hours as needed. 02/24/16   Donnetta Hutching, MD  oxyCODONE-acetaminophen (PERCOCET) 5-325 MG tablet Take 1-2 tablets by mouth every 4 (four) hours as needed. 02/24/16   Donnetta Hutching, MD  tamsulosin (FLOMAX) 0.4 MG CAPS capsule Take 1 capsule (0.4 mg total) by mouth daily. 02/24/16   Donnetta Hutching, MD   BP 95/64 mmHg  Pulse 92  Temp(Src) 98.1 F (36.7 C) (Oral)  Resp 18  Ht 5\' 6"  (1.676 m)  Wt 225 lb (102.059 kg)  BMI 36.33 kg/m2  SpO2 99%  LMP 02/10/2016 Physical Exam  Constitutional: She appears well-developed and well-nourished. She appears distressed.  Appears uncomfortable Glasgow Coma Score 15, riding on bed  HENT:  Head: Normocephalic and atraumatic.  Eyes: Conjunctivae are normal. Pupils are equal, round, and reactive to light.  Neck: Neck supple. No tracheal deviation present. No thyromegaly present.  Cardiovascular: Normal rate and regular rhythm.   No murmur heard. Pulmonary/Chest: Effort normal and breath sounds normal.  Abdominal: Soft. Bowel sounds are normal. She exhibits no distension. There is no tenderness.  Obese  Genitourinary:  No flank tenderness  Musculoskeletal: Normal range of motion. She exhibits no edema or tenderness.  Neurological: She is alert. Coordination normal.  Skin: Skin is warm and dry. No rash noted.  Psychiatric:  She has a normal mood and affect.  Nursing note and vitals reviewed.   ED Course  Procedures (including critical care time) Labs Review Labs Reviewed  URINALYSIS, ROUTINE W REFLEX MICROSCOPIC (NOT AT [facility]) - Abnormal; Notable for the following:    Hgb urine dipstick LARGE (*)    Nitrite POSITIVE (*)    Leukocytes, UA SMALL (*)    All other components within normal limits  URINE MICROSCOPIC-ADD ON - Abnormal; Notable for the  following:    Squamous Epithelial / LPF 0-5 (*)    Bacteria, UA MANY (*)    All other components within normal limits  PREGNANCY, URINE  CBC WITH DIFFERENTIAL/PLATELET  BASIC METABOLIC PANEL    Imaging Review No results found. I have personally reviewed and evaluated these images and lab results as part of my medical decision-making.   EKG Interpretation None     10:15 AM patient is resting comfortably. Has no nausea and pain is minimal after treatment with intravenous Reglan and hydromorphone. X-ray viewed by me Results for orders placed or performed during the hospital encounter of 03/15/16  Urinalysis, Routine w reflex microscopic-may I&O cath if menses (not at [facility])  Result Value Ref Range   Color, Urine YELLOW YELLOW   APPearance CLEAR CLEAR   Specific Gravity, Urine 1.010 1.005 - 1.030   pH 6.0 5.0 - 8.0   Glucose, UA NEGATIVE NEGATIVE mg/dL   Hgb urine dipstick LARGE (A) NEGATIVE   Bilirubin Urine NEGATIVE NEGATIVE   Ketones, ur NEGATIVE NEGATIVE mg/dL   Protein, ur NEGATIVE NEGATIVE mg/dL   Nitrite POSITIVE (A) NEGATIVE   Leukocytes, UA SMALL (A) NEGATIVE  Pregnancy, urine  Result Value Ref Range   Preg Test, Ur NEGATIVE NEGATIVE  Urine microscopic-add on  Result Value Ref Range   Squamous Epithelial / LPF 0-5 (A) NONE SEEN   WBC, UA TOO NUMEROUS TO COUNT 0 - 5 WBC/hpf   RBC / HPF TOO NUMEROUS TO COUNT 0 - 5 RBC/hpf   Bacteria, UA MANY (A) NONE SEEN  CBC with Differential/Platelet  Result Value Ref Range   WBC 11.6 (H) 4.0 - 10.5 K/uL   RBC 4.50 3.87 - 5.11 MIL/uL   Hemoglobin 11.6 (L) 12.0 - 15.0 g/dL   HCT 16.1 09.6 - 04.5 %   MCV 80.2 78.0 - 100.0 fL   MCH 25.8 (L) 26.0 - 34.0 pg   MCHC 32.1 30.0 - 36.0 g/dL   RDW 40.9 81.1 - 91.4 %   Platelets 314 150 - 400 K/uL   Neutrophils Relative % 86 %   Neutro Abs 10.0 (H) 1.7 - 7.7 K/uL   Lymphocytes Relative 8 %   Lymphs Abs 0.9 0.7 - 4.0 K/uL   Monocytes Relative 6 %   Monocytes Absolute 0.7 0.1 - 1.0  K/uL   Eosinophils Relative 0 %   Eosinophils Absolute 0.1 0.0 - 0.7 K/uL   Basophils Relative 0 %   Basophils Absolute 0.0 0.0 - 0.1 K/uL  Basic metabolic panel  Result Value Ref Range   Sodium 138 135 - 145 mmol/L   Potassium 4.3 3.5 - 5.1 mmol/L   Chloride 103 101 - 111 mmol/L   CO2 26 22 - 32 mmol/L   Glucose, Bld 286 (H) 65 - 99 mg/dL   BUN 15 6 - 20 mg/dL   Creatinine, Ser 7.82 0.44 - 1.00 mg/dL   Calcium 8.8 (L) 8.9 - 10.3 mg/dL   GFR calc non Af Amer >60 >60 mL/min  GFR calc Af Amer >60 >60 mL/min   Anion gap 9 5 - 15   Dg Chest 2 View  02/24/2016  CLINICAL DATA:  Back pain radiating into chest since since noon today, vomiting, mild shortness of breath. History of diabetes. EXAM: CHEST  2 VIEW COMPARISON:  Chest x-ray dated 08/18/2014. FINDINGS: Cardiomediastinal silhouette is normal in size and configuration. Lungs are clear. Lung volumes are normal. No evidence of pneumonia. No pleural effusion. No pneumothorax. Osseous and soft tissue structures about the chest are unremarkable. IMPRESSION: No active cardiopulmonary disease. Electronically Signed   By: Bary RichardStan  Maynard M.D.   On: 02/24/2016 21:09   Dg Abd 1 View  03/15/2016  CLINICAL DATA:  Nephrolithiasis.  Right flank pain for 2 weeks EXAM: ABDOMEN - 1 VIEW COMPARISON:  CT abdomen and pelvis February 24, 2016 FINDINGS: There is a calcification either in or overlying the lower pole left kidney measuring 8 x 6 mm. No right-sided renal calculus is seen by radiography. There are apparent phleboliths in the pelvis. There is moderate stool throughout the colon. The bowel gas pattern is unremarkable. No demonstrable bowel obstruction or free air. IMPRESSION: 8 x 6 mm calcification either in or overlying the lower pole left kidney. Phleboliths are noted in the pelvis. No other abnormal calcifications identified. Bowel gas pattern unremarkable. Electronically Signed   By: Bretta BangWilliam  Woodruff III M.D.   On: 03/15/2016 08:17   [place] Renal  03/15/2016   CLINICAL DATA:  40 year old female with right flank pain for 2 weeks. Kidney stones. Initial encounter. EXAM: RENAL / URINARY TRACT ULTRASOUND COMPLETE COMPARISON:  CT Abdomen and Pelvis 02/24/2016 FINDINGS: Right Kidney: Length: 12.4 cm. New mild hydronephrosis (image 9). Normal cortical echogenicity. No right renal mass. Left Kidney: Length: 11.8 cm. Resolved mild left hydronephrosis since the prior CT Abdomen and Pelvis. Lower pole region 8 mm calculus again suspected (image 33). Normal cortical echogenicity. No left renal mass. Bladder: No urinary debris. Mild right ureteral jet was detected. Other findings: Hepatic steatosis. IMPRESSION: 1. New mild right hydronephrosis since the CT Abdomen and Pelvis in March suggesting Acute Obstructive Uropathy. At that time there was a 4-5 mm intra renal calculus which might be the obstructing lesion. Repeat noncontrast CT Abdomen and Pelvis would evaluate further as needed. 2. Resolved left obstructive uropathy since the prior CT. Left lower pole 8 mm calculus. Electronically Signed   By: Odessa FlemingH  Hall M.D.   On: 03/15/2016 09:55   Ct Renal Stone Study  02/24/2016  CLINICAL DATA:  Sudden onset of left flank pain radiating 8 abdomen beginning earlier today. Inability to void urine. EXAM: CT ABDOMEN AND PELVIS WITHOUT CONTRAST TECHNIQUE: Multidetector CT imaging of the abdomen and pelvis was performed following the standard protocol without IV contrast. COMPARISON:  None. FINDINGS: Lower chest:  No acute findings. Hepatobiliary: Liver is diffusely low in density indicating fatty infiltration. Gallbladder is unremarkable. No bile duct dilatation. Pancreas: No mass or inflammatory process identified on this un-enhanced exam. Spleen: Within normal limits in size. Adrenals/Urinary Tract: 6 x 4 mm stone within the proximal left ureter causing mild left-sided hydronephrosis. 3 mm nonobstructing right renal stone. Bladder is decompressed. Stomach/Bowel: Bowel is normal in caliber.  Scattered diverticulosis within the descending and sigmoid colon without evidence of acute diverticulitis. No bowel wall thickening or evidence of bowel wall inflammation. Appendix is not well seen but there are no inflammatory changes about the cecum to suggest acute appendicitis. Vascular/Lymphatic: No pathologically enlarged lymph nodes. No evidence of abdominal  aortic aneurysm. Reproductive: No mass or other significant abnormality. Other: No free fluid or abscess collection. No free intraperitoneal air. Musculoskeletal: No acute or suspicious osseous lesion. Mild degenerative change within the lumbar spine. Small periumbilical abdominal wall hernia which contains fat only. IMPRESSION: 1. 6 x 4 mm stone within the proximal left ureter, just distal to the left UPJ, causing mild hydronephrosis. 2. 3 mm nonobstructing right renal stone. 3. Fatty infiltration of the liver. 4. Mild colonic diverticulosis without evidence of acute diverticulitis. Electronically Signed   By: Bary Richard M.D.   On: 02/24/2016 23:22    MDM  I Suspect the patient has ureteral stone given mild hydronephrosis on right side seen on ultrasound and prior kidney stone on right side. Patient also has urinary tract infection. She is nontoxic-appearing, afebrile. I discussed case with Dr.Herrrick from urology. Plan urine to be sent for culture. Prescription Bactrim DS, Percocet, Reglan. She will get follow-up at urology office within the next few days she is instructed to return for fever, vomiting or if she feels worse She should also follow-up with her primary care physician Dr.Nyland to arrange for hemoglobin A1c.  Dx#1 ureteral colic #2 UTI #3 hyperglycemia Final diagnoses:  None        Doug Sou, MD 03/15/16 1025

## 2016-03-15 NOTE — ED Notes (Signed)
Pt passed a stone while obtaining UA.  Continues to have right flank pain.  Noted on previous CT that pt had left and right sided stones.

## 2016-03-15 NOTE — Discharge Instructions (Signed)
Kidney Stones Call [facility] today to schedule the next available appointment. Tell office staff that Dr.Burnice Oestreicher spoke with Dr.Herrick about your case. You should be seen this week. Call Dr.Nyland's office today to schedule appointment within the next few weeks. Ask for a test before known as hemoglobin A1c to check you for diabetes. Your blood sugar today was mildly elevated at 286. Return if your pain is not well controlled with the medication prescribed. If you've vomit after taking the medication prescribed or if you develop fever or are concerned for any reason. Kidney stones (urolithiasis) are deposits that form inside your kidneys. The intense pain is caused by the stone moving through the urinary tract. When the stone moves, the ureter goes into spasm around the stone. The stone is usually passed in the urine.  CAUSES   A disorder that makes certain neck glands produce too much parathyroid hormone (primary hyperparathyroidism).  A buildup of uric acid crystals, similar to gout in your joints.  Narrowing (stricture) of the ureter.  A kidney obstruction present at birth (congenital obstruction).  Previous surgery on the kidney or ureters.  Numerous kidney infections. SYMPTOMS   Feeling sick to your stomach (nauseous).  Throwing up (vomiting).  Blood in the urine (hematuria).  Pain that usually spreads (radiates) to the groin.  Frequency or urgency of urination. DIAGNOSIS   Taking a history and physical exam.  Blood or urine tests.  CT scan.  Occasionally, an examination of the inside of the urinary bladder (cystoscopy) is performed. TREATMENT   Observation.  Increasing your fluid intake.  Extracorporeal shock wave lithotripsy--This is a noninvasive procedure that uses shock waves to break up kidney stones.  Surgery may be needed if you have severe pain or persistent obstruction. There are various surgical procedures. Most of the procedures are performed  with the use of small instruments. Only small incisions are needed to accommodate these instruments, so recovery time is minimized. The size, location, and chemical composition are all important variables that will determine the proper choice of action for you. Talk to your health care provider to better understand your situation so that you will minimize the risk of injury to yourself and your kidney.  HOME CARE INSTRUCTIONS   Drink enough water and fluids to keep your urine clear or pale yellow. This will help you to pass the stone or stone fragments.  Strain all urine through the provided strainer. Keep all particulate matter and stones for your health care provider to see. The stone causing the pain may be as small as a grain of salt. It is very important to use the strainer each and every time you pass your urine. The collection of your stone will allow your health care provider to analyze it and verify that a stone has actually passed. The stone analysis will often identify what you can do to reduce the incidence of recurrences.  Only take over-the-counter or prescription medicines for pain, discomfort, or fever as directed by your health care provider.  Keep all follow-up visits as told by your health care provider. This is important.  Get follow-up X-rays if required. The absence of pain does not always mean that the stone has passed. It may have only stopped moving. If the urine remains completely obstructed, it can cause loss of kidney function or even complete destruction of the kidney. It is your responsibility to make sure X-rays and follow-ups are completed. Ultrasounds of the kidney can show blockages and the status of the  kidney. Ultrasounds are not associated with any radiation and can be performed easily in a matter of minutes.  Make changes to your daily diet as told by your health care provider. You may be told to:  Limit the amount of salt that you eat.  Eat 5 or more servings  of fruits and vegetables each day.  Limit the amount of meat, poultry, fish, and eggs that you eat.  Collect a 24-hour urine sample as told by your health care provider.You may need to collect another urine sample every 6-12 months. SEEK MEDICAL CARE IF:  You experience pain that is progressive and unresponsive to any pain medicine you have been prescribed. SEEK IMMEDIATE MEDICAL CARE IF:   Pain cannot be controlled with the prescribed medicine.  You have a fever or shaking chills.  The severity or intensity of pain increases over 18 hours and is not relieved by pain medicine.  You develop a new onset of abdominal pain.  You feel faint or pass out.  You are unable to urinate.   This information is not intended to replace advice given to you by your health care provider. Make sure you discuss any questions you have with your health care provider.   Document Released: 11/28/2005 Document Revised: 08/19/2015 Document Reviewed: 05/01/2013 Elsevier Interactive Patient Education [employer]

## 2016-03-15 NOTE — ED Notes (Signed)
Pt reports right flank pain since this am. Pt reports pain radiating to RLQ. Pt reports nausea denies v/d. Pt reports history of same x2 weeks ago. nad noted.

## 2016-03-17 LAB — URINE CULTURE: Culture: >=100000 — AB

## 2016-03-18 ENCOUNTER — Telehealth: Payer: Self-pay

## 2016-03-18 NOTE — Telephone Encounter (Signed)
Post ED Visit - Positive Culture Follow-up  Culture report reviewed by antimicrobial stewardship pharmacist:  []  Enzo BiNathan Batchelder, Pharm.D. []  Celedonio MiyamotoJeremy Frens, Pharm.D., BCPS []  Garvin FilaMike Maccia, Pharm.D. []  Georgina PillionElizabeth Martin, Pharm.D., BCPS []  TraverMinh Pham, [address]Ph[address]D., BCPS, AAHIVP []  Estella HuskMichelle Turner, Pharm.D., BCPS, AAHIVP []  Cassie Stewart, [address] []  Sherle Poeob Vincent, [address]Ph[address] Cassie Stewart Pharm D Positive Urine culture Treated with Trimeth/Sulfa, organism sensitive to the same and no further patient follow-up is required at this time.  Jerry CarasCullom, Madeleine Fenn Burnett 03/18/2016, 10:31 AM

## 2016-03-22 ENCOUNTER — Ambulatory Visit: Payer: BLUE CROSS/BLUE SHIELD | Admitting: Urology

## 2016-04-20 ENCOUNTER — Ambulatory Visit (INDEPENDENT_AMBULATORY_CARE_PROVIDER_SITE_OTHER): Payer: BLUE CROSS/BLUE SHIELD | Admitting: Urology

## 2016-04-20 DIAGNOSIS — N200 Calculus of kidney: Secondary | ICD-10-CM

## 2016-06-29 ENCOUNTER — Other Ambulatory Visit: Payer: Self-pay | Admitting: Urology

## 2016-06-29 DIAGNOSIS — N200 Calculus of kidney: Secondary | ICD-10-CM

## 2016-07-20 ENCOUNTER — Ambulatory Visit (HOSPITAL_COMMUNITY): Admission: RE | Admit: 2016-07-20 | Payer: 59 | Source: Ambulatory Visit

## 2016-07-27 ENCOUNTER — Ambulatory Visit: Payer: BLUE CROSS/BLUE SHIELD | Admitting: Urology

## 2016-09-06 ENCOUNTER — Ambulatory Visit (HOSPITAL_COMMUNITY)
Admission: RE | Admit: 2016-09-06 | Discharge: 2016-09-06 | Disposition: A | Discharge status: Home / Self Care | Payer: 59 | Source: Ambulatory Visit | Attending: Urology | Admitting: Urology

## 2016-09-06 DIAGNOSIS — N200 Calculus of kidney: Secondary | ICD-10-CM | POA: Diagnosis present

## 2016-09-07 ENCOUNTER — Ambulatory Visit (INDEPENDENT_AMBULATORY_CARE_PROVIDER_SITE_OTHER): Payer: 59 | Admitting: Urology

## 2016-09-07 DIAGNOSIS — R1084 Generalized abdominal pain: Secondary | ICD-10-CM | POA: Diagnosis not present

## 2016-09-07 DIAGNOSIS — N200 Calculus of kidney: Secondary | ICD-10-CM

## 2016-11-20 ENCOUNTER — Emergency Department (HOSPITAL_COMMUNITY)
Admission: EM | Admit: 2016-11-20 | Discharge: 2016-11-20 | Disposition: A | Discharge status: Home / Self Care | Payer: 59 | Attending: Emergency Medicine | Admitting: Emergency Medicine

## 2016-11-20 ENCOUNTER — Emergency Department (HOSPITAL_COMMUNITY): Payer: 59

## 2016-11-20 ENCOUNTER — Encounter (HOSPITAL_COMMUNITY): Payer: Self-pay | Admitting: *Deleted

## 2016-11-20 DIAGNOSIS — N390 Urinary tract infection, site not specified: Secondary | ICD-10-CM | POA: Insufficient documentation

## 2016-11-20 DIAGNOSIS — K5732 Diverticulitis of large intestine without perforation or abscess without bleeding: Secondary | ICD-10-CM | POA: Diagnosis not present

## 2016-11-20 DIAGNOSIS — N201 Calculus of ureter: Secondary | ICD-10-CM | POA: Diagnosis not present

## 2016-11-20 DIAGNOSIS — Z791 Long term (current) use of non-steroidal anti-inflammatories (NSAID): Secondary | ICD-10-CM | POA: Diagnosis not present

## 2016-11-20 DIAGNOSIS — I5031 Acute diastolic (congestive) heart failure: Secondary | ICD-10-CM | POA: Insufficient documentation

## 2016-11-20 DIAGNOSIS — R109 Unspecified abdominal pain: Secondary | ICD-10-CM | POA: Diagnosis present

## 2016-11-20 DIAGNOSIS — Z87891 Personal history of nicotine dependence: Secondary | ICD-10-CM | POA: Diagnosis not present

## 2016-11-20 DIAGNOSIS — R319 Hematuria, unspecified: Secondary | ICD-10-CM

## 2016-11-20 LAB — URINALYSIS, ROUTINE W REFLEX MICROSCOPIC
BILIRUBIN URINE: NEGATIVE
GLUCOSE, UA: NEGATIVE mg/dL
Ketones, ur: NEGATIVE mg/dL
NITRITE: POSITIVE — AB
PH: 5 (ref 5.0–8.0)
Protein, ur: NEGATIVE mg/dL
SPECIFIC GRAVITY, URINE: 1.02 (ref 1.005–1.030)

## 2016-11-20 LAB — COMPREHENSIVE METABOLIC PANEL
ALBUMIN: 3.8 g/dL (ref 3.5–5.0)
ALK PHOS: 95 U/L (ref 38–126)
ALT: 23 U/L (ref 14–54)
ANION GAP: 7 (ref 5–15)
AST: 21 U/L (ref 15–41)
BILIRUBIN TOTAL: 0.5 mg/dL (ref 0.3–1.2)
BUN: 18 mg/dL (ref 6–20)
CALCIUM: 8.9 mg/dL (ref 8.9–10.3)
CO2: 26 mmol/L (ref 22–32)
Chloride: 102 mmol/L (ref 101–111)
Creatinine, Ser: 0.97 mg/dL (ref 0.44–1.00)
GFR calc Af Amer: >60 mL/min (ref >60)
GLUCOSE: 189 mg/dL — AB (ref 65–99)
Potassium: 3.9 mmol/L (ref 3.5–5.1)
Sodium: 135 mmol/L (ref 135–145)
TOTAL PROTEIN: 7.9 g/dL (ref 6.5–8.1)

## 2016-11-20 LAB — CBC WITH DIFFERENTIAL/PLATELET
BASOS ABS: 0 10*3/uL (ref 0.0–0.1)
BASOS PCT: 0 %
EOS ABS: 0.3 10*3/uL (ref 0.0–0.7)
EOS PCT: 3 %
HCT: 41.1 % (ref 36.0–46.0)
Hemoglobin: 13.1 g/dL (ref 12.0–15.0)
Lymphocytes Relative: 21 %
Lymphs Abs: 2.7 10*3/uL (ref 0.7–4.0)
MCH: 26.3 pg (ref 26.0–34.0)
MCHC: 31.9 g/dL (ref 30.0–36.0)
MCV: 82.5 fL (ref 78.0–100.0)
MONO ABS: 0.8 10*3/uL (ref 0.1–1.0)
MONOS PCT: 7 %
Neutro Abs: 8.9 10*3/uL — ABNORMAL HIGH (ref 1.7–7.7)
Neutrophils Relative %: 69 %
PLATELETS: 302 10*3/uL (ref 150–400)
RBC: 4.98 MIL/uL (ref 3.87–5.11)
RDW: 13.5 % (ref 11.5–15.5)
WBC: 12.8 10*3/uL — ABNORMAL HIGH (ref 4.0–10.5)

## 2016-11-20 LAB — PREGNANCY, URINE: PREG TEST UR: NEGATIVE

## 2016-11-20 MED ORDER — KETOROLAC TROMETHAMINE 30 MG/ML IJ SOLN
15.0000 mg | Freq: Once | INTRAMUSCULAR | Status: AC
  Administered 2016-11-20: 15 mg via INTRAVENOUS
  Filled 2016-11-20: qty 1

## 2016-11-20 MED ORDER — HYDROMORPHONE HCL 1 MG/ML IJ SOLN
1.0000 mg | Freq: Once | INTRAMUSCULAR | Status: AC
  Administered 2016-11-20: 1 mg via INTRAVENOUS
  Filled 2016-11-20: qty 1

## 2016-11-20 MED ORDER — CIPROFLOXACIN HCL 250 MG PO TABS
500.0000 mg | ORAL_TABLET | Freq: Once | ORAL | Status: AC
  Administered 2016-11-20: 500 mg via ORAL
  Filled 2016-11-20: qty 2

## 2016-11-20 MED ORDER — METRONIDAZOLE IN NACL 5-0.79 MG/ML-% IV SOLN
500.0000 mg | Freq: Once | INTRAVENOUS | Status: AC
  Administered 2016-11-20: 500 mg via INTRAVENOUS
  Filled 2016-11-20: qty 100

## 2016-11-20 MED ORDER — METRONIDAZOLE 500 MG PO TABS: 500.0000 mg | ORAL_TABLET | Freq: Three times a day (TID) | ORAL | 0 refills | Status: DC

## 2016-11-20 MED ORDER — CIPROFLOXACIN HCL 500 MG PO TABS: 500.0000 mg | ORAL_TABLET | Freq: Two times a day (BID) | ORAL | 0 refills | Status: DC

## 2016-11-20 MED ORDER — ONDANSETRON HCL 4 MG/2ML IJ SOLN
4.0000 mg | Freq: Once | INTRAMUSCULAR | Status: AC
  Administered 2016-11-20: 4 mg via INTRAVENOUS
  Filled 2016-11-20: qty 2

## 2016-11-20 MED ORDER — CEFTRIAXONE SODIUM 1 G IJ SOLR
1.0000 g | Freq: Once | INTRAMUSCULAR | Status: AC
  Administered 2016-11-20: 1 g via INTRAVENOUS
  Filled 2016-11-20: qty 10

## 2016-11-20 NOTE — ED Notes (Signed)
Pt oxygenation is at 98%.  She was switching her the finger probe to another finger.

## 2016-11-20 NOTE — ED Provider Notes (Signed)
[facility] DEPT Provider Note   CSN: [id number] Arrival date & time: 11/20/16  0522  Time seen 05:35 AM   History   Chief Complaint Chief Complaint  Patient presents with  . Flank Pain    HPI Martha Morgan is a 40 y.o. female.  HPI    patient reports a history of renal stones. She states her last renal stone was in March on her left side. She followed up with urology and states they were just going to observe her. She states she started having pain again at 12 midnight tonight. She has had nausea and vomiting 4-5 times. She denies diarrhea. She denies hematuria. She states nothing she does makes it feel worse, nothing she does makes it feel better. She states this pain feels like the last time she had the kidney stone. She reports she's had a total of 2 abdominal CT scans in the past 6 years. She has cut out caffeine and states there is no family history of renal stones.    [facility]  Dr [name]  Past Medical History:  Diagnosis Date  . Gestational diabetes mellitus in pregnancy, insulin controlled   . History of pregnancy induced hypertension   . Obesity     Patient Active Problem List   Diagnosis Date Noted  . Acute diastolic heart failure: volume overload, normal EF, but hypoxia. 08/19/2014  . Sinus tachycardia 08/18/2014  . Hypoxia 08/18/2014  . Pre-eclampsia, delivered 08/18/2014  . History of pregnancy induced hypertension   . Gestational diabetes mellitus in pregnancy, insulin controlled   . Obesity     Past Surgical History:  Procedure Laterality Date  . CESAREAN SECTION    . RECTOVAGINAL FISTULA CLOSURE      OB History    Gravida Para Term Preterm AB Living   4 2     2 2    SAB TAB Ectopic Multiple Live Births                  Obstetric Comments   [facility]       Home Medications    Prior to Admission medications   Medication Sig Start Date End Date Taking? Authorizing Provider    ibuprofen (ADVIL,MOTRIN) 200 MG tablet Take 400 mg by mouth every 6 (six) hours as needed for moderate pain.    Historical Provider, MD  metoCLOPramide (REGLAN) 10 MG tablet Take 1 tablet (10 mg total) by mouth every 6 (six) hours as needed for nausea (nausea/headache). 03/15/16   Doug SouSam Jacubowitz, MD  ondansetron (ZOFRAN) 8 MG tablet Take 1 tablet (8 mg total) by mouth every 4 (four) hours as needed. 02/24/16   Donnetta HutchingBrian Cook, MD  oxyCODONE-acetaminophen (PERCOCET) 5-325 MG tablet Take 1-2 tablets by mouth every 4 (four) hours as needed. 02/24/16   Donnetta HutchingBrian Cook, MD  oxyCODONE-acetaminophen (PERCOCET) 5-325 MG tablet Take 1-2 tablets by mouth every 4 (four) hours as needed. 03/15/16   Doug SouSam Jacubowitz, MD  tamsulosin (FLOMAX) 0.4 MG CAPS capsule Take 1 capsule (0.4 mg total) by mouth daily. 02/24/16   Donnetta HutchingBrian Cook, MD    Family History No family history on file.  Social History Social History  Substance Use Topics  . Smoking status: Former Games developermoker  . Smokeless tobacco: Never Used  . Alcohol use No  employed as patient advocate in domestic violence   Allergies   Patient has no known allergies.   Review of Systems Review of Systems  Constitutional: Negative.  Negative  for activity change, appetite change and fever.  Eyes: Negative.   Respiratory: Negative.   Cardiovascular: Negative.   All other systems reviewed and are negative.    Physical Exam Updated Vital Signs BP (!) 143/114 (BP Location: Left Arm)   Pulse 93   Temp 97.5 F (36.4 C) (Oral)   Resp 20   LMP 10/22/2016   SpO2 100%   Vital signs normal except for hypertension   Physical Exam  Constitutional: She is oriented to person, place, and time. She appears well-developed and well-nourished.  Non-toxic appearance. She does not appear ill. She appears distressed.  Appears uncomfortable  HENT:  Head: Normocephalic and atraumatic.  Right Ear: External ear normal.  Left Ear: External ear normal.  Nose: Nose normal. No mucosal  edema or rhinorrhea.  Mouth/Throat: Oropharynx is clear and moist and mucous membranes are normal. No dental abscesses or uvula swelling.  Eyes: Conjunctivae and EOM are normal. Pupils are equal, round, and reactive to light.  Neck: Normal range of motion and full passive range of motion without pain. Neck supple.  Cardiovascular: Normal rate, regular rhythm and normal heart sounds.  Exam reveals no gallop and no friction rub.   No murmur heard. Pulmonary/Chest: Effort normal and breath sounds normal. No respiratory distress. She has no wheezes. She has no rhonchi. She has no rales. She exhibits no tenderness and no crepitus.  Abdominal: Soft. Normal appearance and bowel sounds are normal. She exhibits no distension. There is no tenderness. There is no rebound and no guarding.  Indicates her pain is in her left flank and in her left upper quadrant, however she is nonpainful to palpation in those areas  Musculoskeletal: Normal range of motion. She exhibits no edema or tenderness.  Moves all extremities well.   Neurological: She is alert and oriented to person, place, and time. She has normal strength. No cranial nerve deficit.  Skin: Skin is warm, dry and intact. No rash noted. No erythema. No pallor.  Psychiatric: She has a normal mood and affect. Her speech is normal and behavior is normal. Her mood appears not anxious.  Nursing note and vitals reviewed.    ED Treatments / Results  Labs (all labs ordered are listed, but only abnormal results are displayed) Results for orders placed or performed during the hospital encounter of 11/20/16  CBC with Differential  Result Value Ref Range   WBC 12.8 (H) 4.0 - 10.5 K/uL   RBC 4.98 3.87 - 5.11 MIL/uL   Hemoglobin 13.1 12.0 - 15.0 g/dL   HCT 16.1 09.6 - 04.5 %   MCV 82.5 78.0 - 100.0 fL   MCH 26.3 26.0 - 34.0 pg   MCHC 31.9 30.0 - 36.0 g/dL   RDW 40.9 81.1 - 91.4 %   Platelets 302 150 - 400 K/uL   Neutrophils Relative % 69 %   Neutro Abs 8.9  (H) 1.7 - 7.7 K/uL   Lymphocytes Relative 21 %   Lymphs Abs 2.7 0.7 - 4.0 K/uL   Monocytes Relative 7 %   Monocytes Absolute 0.8 0.1 - 1.0 K/uL   Eosinophils Relative 3 %   Eosinophils Absolute 0.3 0.0 - 0.7 K/uL   Basophils Relative 0 %   Basophils Absolute 0.0 0.0 - 0.1 K/uL  Urinalysis, Routine w reflex microscopic  Result Value Ref Range   Color, Urine YELLOW YELLOW   APPearance HAZY (A) CLEAR   Specific Gravity, Urine 1.020 1.005 - 1.030   pH 5.0 5.0 - 8.0  Glucose, UA NEGATIVE NEGATIVE mg/dL   Hgb urine dipstick SMALL (A) NEGATIVE   Bilirubin Urine NEGATIVE NEGATIVE   Ketones, ur NEGATIVE NEGATIVE mg/dL   Protein, ur NEGATIVE NEGATIVE mg/dL   Nitrite POSITIVE (A) NEGATIVE   Leukocytes, UA SMALL (A) NEGATIVE   RBC / HPF 6-30 0 - 5 RBC/hpf   WBC, UA TOO NUMEROUS TO COUNT 0 - 5 WBC/hpf   Bacteria, UA MANY (A) NONE SEEN   Mucous PRESENT   Comprehensive metabolic panel  Result Value Ref Range   Sodium 135 135 - 145 mmol/L   Potassium 3.9 3.5 - 5.1 mmol/L   Chloride 102 101 - 111 mmol/L   CO2 26 22 - 32 mmol/L   Glucose, Bld 189 (H) 65 - 99 mg/dL   BUN 18 6 - 20 mg/dL   Creatinine, Ser 9.600.97 0.44 - 1.00 mg/dL   Calcium 8.9 8.9 - 45.410.3 mg/dL   Total Protein 7.9 6.5 - 8.1 g/dL   Albumin 3.8 3.5 - 5.0 g/dL   AST 21 15 - 41 U/L   ALT 23 14 - 54 U/L   Alkaline Phosphatase 95 38 - 126 U/L   Total Bilirubin 0.5 0.3 - 1.2 mg/dL   GFR calc non Af Amer >60 >60 mL/min   GFR calc Af Amer >60 >60 mL/min   Anion gap 7 5 - 15  Pregnancy, urine  Result Value Ref Range   Preg Test, Ur NEGATIVE NEGATIVE   Laboratory interpretation all normal except uti, leukocytosis, hyperglycemia    EKG  EKG Interpretation None       Radiology Ct Renal Stone Study  Result Date: 11/20/2016 CLINICAL DATA:  Left flank pain radiating to left upper quadrant. UTI. History of kidney stones. EXAM: CT ABDOMEN AND PELVIS WITHOUT CONTRAST TECHNIQUE: Multidetector CT imaging of the abdomen and  pelvis was performed following the standard protocol without IV contrast. COMPARISON:  02/24/2016 and 06/03/2013 FINDINGS: Lower chest: Lung bases are within normal. Hepatobiliary: Diffuse hepatic steatosis without focal mass. Focal fatty sparing adjacent the gallbladder fossa. Gallbladder and biliary tree are within normal. Pancreas: Within normal. Spleen: Within normal. Adrenals/Urinary Tract: Adrenal glands are normal. Kidneys are normal in size. Mild left-sided hydronephrosis is present as there is a 7 mm stone at the UPJ causing low-grade obstruction without significant change from the prior exam no right renal stones. Ureters are otherwise within normal. The bladder is normal. Stomach/Bowel: The stomach and small bowel are within normal. Small appendix versus appendiceal stump is within normal. Mild diverticulosis of the colon. There is subtle inflammatory change adjacent a small sigmoid diverticula likely early diverticulitis. No perforation or abscess. Vascular/Lymphatic: Subtle calcified plaque over the distal abdominal aorta. Remaining vascular structures are within normal. No evidence of adenopathy. Reproductive: Within normal. Other: Tiny umbilical hernia containing only peritoneal fat. Musculoskeletal: Minimal degenerate change of the spine. IMPRESSION: 7 mm stone at the left UVJ causing low-grade obstruction as these findings not significantly changed compared to the prior exam. Colonic diverticulosis. There is evidence of mild acute diverticulitis of the sigmoid colon. No perforation or abscess. The hepatic steatosis without focal mass. Small umbilical hernia containing only peritoneal fat. Electronically Signed   By: Elberta Fortisaniel  Boyle M.D.   On: 11/20/2016 07:45      Study Result    CT A/P February 24, 2016 CLINICAL DATA:  Sudden onset of left flank pain radiating 8 abdomen beginning earlier today. Inability to void urine.  EXAM: CT ABDOMEN AND PELVIS WITHOUT  CONTRAST  TECHNIQUE: Multidetector CT imaging of the abdomen and pelvis was performed following the standard protocol without IV contrast.  COMPARISON:  None.    Adrenals/Urinary Tract: 6 x 4 mm stone within the proximal left ureter causing mild left-sided hydronephrosis. 3 mm nonobstructing right renal stone. Bladder is decompressed.    IMPRESSION: 1. 6 x 4 mm stone within the proximal left ureter, just distal to the left UPJ, causing mild hydronephrosis. 2. 3 mm nonobstructing right renal stone. 3. Fatty infiltration of the liver. 4. Mild colonic diverticulosis without evidence of acute diverticulitis.     September 06, 2016 CLINICAL DATA:  History of kidney stones  EXAM: RENAL / URINARY TRACT ULTRASOUND COMPLETE  COMPARISON:  Renal ultrasound of March 15, 2016.   IMPRESSION: No hydronephrosis on today's study. Probable 9 mm lower pole stone on the left.   Electronically Signed   By: David  [place] M.D.   On: 09/06/2016 09:07      Procedures Procedures (including critical care time)  Medications Ordered in ED Medications  HYDROmorphone (DILAUDID) injection 1 mg (1 mg Intravenous Given 11/20/16 0545)  ondansetron (ZOFRAN) injection 4 mg (4 mg Intravenous Given 11/20/16 0545)  cefTRIAXone (ROCEPHIN) 1 g in dextrose 5 % 50 mL IVPB (1 g Intravenous New Bag/Given 11/20/16 0622)  HYDROmorphone (DILAUDID) injection 1 mg (1 mg Intravenous Given 11/20/16 0745)  ondansetron (ZOFRAN) injection 4 mg (4 mg Intravenous Given 11/20/16 0745)     Initial Impression / Assessment and Plan / ED Course  I have reviewed the triage vital signs and the nursing notes.  Pertinent labs & imaging results that were available during my care of the patient were reviewed by me and considered in my medical decision making (see chart for details).  Clinical Course    Patient was given IV pain and nausea medication. I am hoping to get her pain controlled and get a  ultrasound this morning when they come to the hospital.  Recheck 06:15 pain is better, not gone, nausea gone. She denies fever or chills. After reviewing her urinalysis she was started on IV Rocephin and urine culture was ordered.  06:43 AM Dr [name], wants to get renal CT rather than [place] and call back.   06:45 AM Patient informed of  plan to get CT. She appears to be uncomfortable however she is refusing more pain medicine at this time.  Pt did finally ask for more pain medication.  08:30 AM Waiting to talk to Dr [name] after her CT scan. Dr Juleen China will relay that information to him.   Final Clinical Impressions(s) / ED Diagnoses   Final diagnoses:  Left ureteral stone  Urinary tract infection with hematuria, site unspecified    Disposition pending  Devoria Albe, MD, Concha Pyo, MD 11/20/16 [phone]

## 2016-11-20 NOTE — ED Triage Notes (Signed)
Pt reports flank pain that started tonight. Pt has hx of kidney stones.

## 2016-11-23 LAB — URINE CULTURE: SPECIAL REQUESTS: NORMAL

## 2016-11-24 ENCOUNTER — Telehealth (HOSPITAL_BASED_OUTPATIENT_CLINIC_OR_DEPARTMENT_OTHER): Payer: Self-pay

## 2016-11-24 NOTE — Telephone Encounter (Signed)
Post ED Visit - Positive Culture Follow-up  Culture report reviewed by antimicrobial stewardship pharmacist:  [x]  Enzo BiNathan Batchelder, Pharm.D. []  Celedonio MiyamotoJeremy Frens, Pharm.D., BCPS []  Garvin FilaMike Maccia, Pharm.D. []  Georgina PillionElizabeth Martin, Pharm.D., BCPS []  [place]h Pham, [address]Ph[address]D., BCPS, AAHIVP []  Estella HuskMichelle Turner, Pharm.D., BCPS, AAHIVP []  Tennis Mustassie Stewart, Pharm.D. []  Sherle Poeob Vincent, [address]Pharm[address]  Positive urine culture, >/= 100,000 colonies -> E Coli Treated with Ciproflxacin, organism sensitive to the same and no further patient follow-up is required at this time.  Arvid RightClark, Alvy Alsop Dorn 11/24/2016, 12:23 PM

## 2016-11-25 ENCOUNTER — Other Ambulatory Visit: Payer: Self-pay | Admitting: Urology

## 2016-11-25 ENCOUNTER — Ambulatory Visit (INDEPENDENT_AMBULATORY_CARE_PROVIDER_SITE_OTHER): Payer: 59 | Admitting: Urology

## 2016-11-25 ENCOUNTER — Encounter (HOSPITAL_COMMUNITY): Payer: Self-pay | Admitting: General Practice

## 2016-11-25 DIAGNOSIS — K5780 Diverticulitis of intestine, part unspecified, with perforation and abscess without bleeding: Secondary | ICD-10-CM

## 2016-11-25 DIAGNOSIS — N200 Calculus of kidney: Secondary | ICD-10-CM

## 2016-11-25 DIAGNOSIS — N3090 Cystitis, unspecified without hematuria: Secondary | ICD-10-CM

## 2016-11-25 NOTE — H&P (Signed)
Martha HawkingAnnie Morgan Office Visit     11/25/2016   --------------------------------------------------------------------------------   Martha RidgeLoretta Morgan  MRN: [id number]  PRIMARY CARE:  Delaney MeigsLeonard R. Nyland, MD  DOB: 1976-11-17, 40 year old Female  REFERRING:    SSN:   PROVIDER:  Wilkie AyePatrick McKenzie, M.D.    TREATING:  Bjorn PippinJohn Wrenn, M.D.    LOCATION:  [facility] [phone]   --------------------------------------------------------------------------------   CC: I have kidney stones.  HPI: Martha Morgan is a 40 year-old female established patient who is here for renal calculi.  The problem is on the left side. She first stated noticing pain on 11/20/2016. This is not her first kidney stone. She has had 2 stones prior to getting this one. She is not currently having flank pain, back pain, groin pain, nausea, vomiting, fever or chills. She has not caught a stone in her urine strainer since her symptoms began.   She has never had surgical treatment for calculi in the past.   Martha GlassingLoretta is a 40 yo WF who was seeing Dr. [name] for a LLP stone. She had the onset of symptoms on 12/10 with severe left flank pain with nausea and vomiting. She had no fever, hematuria or voiding complaints. She reports that the pain is intermittent now. A CT showed that the stone moved to the LUPJ. She has had 2-3 prior stones and passed them. She has had no stone procedures. She is on cipro and metranidazole for diverticulis and was told she had a UTI. Her culture did grow e. coli that is sensitive to cipro.      ALLERGIES: No Allergies    MEDICATIONS: Ciprofloxacin 500 mg/5 ml suspension, microcapsule reconstituted  Glipizide Xl 2.5 mg tablet, extended release 24 hr  Ibuprofen TABS Oral  Metronidazole 500 mg tablet     GU PSH: No GU PSH      PSH Notes: Rectovaginal Fistula Repair, Cesarean Section   NON-GU PSH: Cesarean Delivery Only - 04/20/2016    GU PMH: Flank Pain - 09/07/2016 Kidney Stone - 09/07/2016,  Nephrolithiasis, - 04/24/2016    NON-GU PMH: Personal history of other endocrine, nutritional and metabolic disease, History of diabetes mellitus - 04/20/2016 Encounter for general adult medical examination without abnormal findings, Encounter for preventive health examination    FAMILY HISTORY: Alive and well - Runs In Family Congestive Heart Failure - Runs In Family Deceased - Runs In Family   SOCIAL HISTORY: No Social History     Notes: Occupation, Alcohol use, Number of children, Former smoker, Caffeine use   REVIEW OF SYSTEMS:    GU Review Female:   Patient denies frequent urination, hard to postpone urination, burning /pain with urination, get up at night to urinate, leakage of urine, stream starts and stops, trouble starting your stream, have to strain to urinate, and currently pregnant.  Gastrointestinal (Upper):   Patient reports nausea. Patient denies vomiting and indigestion/ heartburn.  Gastrointestinal (Lower):   Patient denies diarrhea and constipation.  Constitutional:   Patient denies fever, night sweats, weight loss, and fatigue.  Skin:   Patient denies skin rash/ lesion and itching.  Eyes:   Patient denies blurred vision and double vision.  Ears/ Nose/ Throat:   Patient denies sore throat and sinus problems.  Hematologic/Lymphatic:   Patient denies swollen glands and easy bruising.  Cardiovascular:   Patient denies leg swelling and chest pains.  Respiratory:   Patient denies cough and shortness of breath.  Endocrine:   Patient denies excessive thirst.  Musculoskeletal:   Patient denies back pain and joint pain.  Neurological:   Patient denies headaches and dizziness.  Psychologic:   Patient denies depression and anxiety.   VITAL SIGNS:      11/25/2016 03:29 PM  BP 129/89 mmHg  Pulse 90 /min  Temperature 98.4 F / 37 C   MULTI-SYSTEM PHYSICAL EXAMINATION:    Constitutional: Well-nourished. No physical deformities. Normally developed. Good grooming.   Respiratory:  No labored breathing, no use of accessory muscles. CTA  Cardiovascular: Normal temperature, RRR without murmur     PAST DATA REVIEWED:  Source Of History:  Patient  Records Review:   Previous Hospital Records  Urine Test Review:   Urinalysis, Urine Culture and Sensitivity  X-Ray Review: C.T. Stone Protocol: Reviewed Films. Reviewed Report. 12/10 7.75mm right UPJ stone and mild diverticulitis.    Notes:                      er records and labs reviewed.    PROCEDURES:          Urinalysis - 81003 Dipstick Dipstick Cont'd  Specimen: Voided Bilirubin: Neg  Color: Yellow Ketones: Neg  Appearance: Clear Blood: Neg  Specific Gravity: >= 1.030 Protein: Trace  pH: 6.0 Urobilinogen: 0.2  Glucose: 3+ Nitrites: Neg    Leukocyte Esterase: 1+    ASSESSMENT:      ICD-10 Details  1 GU:   Kidney Stone - N20.0 Left, Worsening - 7.185mm left UPJ stone.   2   Cystitis, Unspec w/o hematuria - N30.90 e. coli on culture from 12/10.  3 NON-GU:   Diverticulitis - K57.80    PLAN:            Medications New Meds: Cipro 500 mg tablet 1 tablet PO Q HS   #7  4 Refill(s)  Promethazine Hcl 25 mg tablet 1 tablet PO Q 6 H PRN   #15  1 Refill(s)  Tramadol Hcl 50 mg tablet 1 tablet PO Q 6 H PRN   #15  0 Refill(s)            Schedule Return Visit: ASAP - Schedule Surgery          Document Letter(s):  Created for Patient: Clinical Summary         Notes:   I discussed the options for therapy including ureteroscopy and ESWL. I think she is unlikely to pass the stone.   I have suggested ESWL and have reviewed the risks of bleeding, infection, injury to the kidney or adjacent organs, need for secondary procedures for obstructing fragments or failure to fragment, cardiac risks, thrombotic events and sedation complications.   I am going to extend the cipro pending the procedure and sent in phenergan and tramadol for her nausea and pain.   I will try to get her on for early next week.   CC: Dr. Joette CatchingLeonard  Nyland.    Signed by Bjorn PippinJohn Wrenn, M.D. on 11/25/16 at 4:13 PM (EST     The information contained in this medical record document is considered private and confidential patient information. This information can only be used for the medical diagnosis and/or medical services that are being provided by the patient's selected caregivers. This information can only be distributed outside of the patient's care if the patient agrees and signs waivers of authorization for this information to be sent to an outside source or route.

## 2016-11-28 ENCOUNTER — Encounter (HOSPITAL_COMMUNITY)
Admission: RE | Disposition: A | Discharge status: Home / Self Care | Payer: Self-pay | Source: Ambulatory Visit | Attending: Urology

## 2016-11-28 ENCOUNTER — Ambulatory Visit (HOSPITAL_COMMUNITY)
Admission: RE | Admit: 2016-11-28 | Discharge: 2016-11-28 | Disposition: A | Discharge status: Home / Self Care | Payer: 59 | Source: Ambulatory Visit | Attending: Urology | Admitting: Urology

## 2016-11-28 ENCOUNTER — Encounter (HOSPITAL_COMMUNITY): Payer: Self-pay | Admitting: General Practice

## 2016-11-28 ENCOUNTER — Ambulatory Visit (HOSPITAL_COMMUNITY): Payer: 59

## 2016-11-28 DIAGNOSIS — Z8249 Family history of ischemic heart disease and other diseases of the circulatory system: Secondary | ICD-10-CM | POA: Insufficient documentation

## 2016-11-28 DIAGNOSIS — E119 Type 2 diabetes mellitus without complications: Secondary | ICD-10-CM | POA: Diagnosis not present

## 2016-11-28 DIAGNOSIS — N200 Calculus of kidney: Secondary | ICD-10-CM | POA: Diagnosis not present

## 2016-11-28 DIAGNOSIS — N390 Urinary tract infection, site not specified: Secondary | ICD-10-CM | POA: Diagnosis not present

## 2016-11-28 DIAGNOSIS — Z87891 Personal history of nicotine dependence: Secondary | ICD-10-CM | POA: Diagnosis not present

## 2016-11-28 DIAGNOSIS — Z87442 Personal history of urinary calculi: Secondary | ICD-10-CM | POA: Insufficient documentation

## 2016-11-28 DIAGNOSIS — E669 Obesity, unspecified: Secondary | ICD-10-CM | POA: Diagnosis not present

## 2016-11-28 HISTORY — DX: Personal history of urinary calculi: Z87.442

## 2016-11-28 LAB — GLUCOSE, CAPILLARY: GLUCOSE-CAPILLARY: 158 mg/dL — AB (ref 65–99)

## 2016-11-28 LAB — PREGNANCY, URINE: PREG TEST UR: NEGATIVE

## 2016-11-28 SURGERY — LITHOTRIPSY, ESWL
Anesthesia: LOCAL | Laterality: Left

## 2016-11-28 MED ORDER — DIPHENHYDRAMINE HCL 25 MG PO CAPS
25.0000 mg | ORAL_CAPSULE | ORAL | Status: AC
  Administered 2016-11-28: 25 mg via ORAL
  Filled 2016-11-28: qty 1

## 2016-11-28 MED ORDER — DIAZEPAM 5 MG PO TABS
10.0000 mg | ORAL_TABLET | ORAL | Status: AC
  Administered 2016-11-28: 10 mg via ORAL
  Filled 2016-11-28: qty 2

## 2016-11-28 MED ORDER — SODIUM CHLORIDE 0.9 % IV SOLN
INTRAVENOUS | Status: DC
  Administered 2016-11-28: 10:00:00 via INTRAVENOUS

## 2016-11-28 MED ORDER — CIPROFLOXACIN HCL 500 MG PO TABS
500.0000 mg | ORAL_TABLET | ORAL | Status: AC
  Administered 2016-11-28: 500 mg via ORAL
  Filled 2016-11-28: qty 1

## 2016-11-28 NOTE — Discharge Instructions (Signed)
Dietary Guidelines to Help Prevent Kidney Stones Your risk of kidney stones can be decreased by adjusting the foods you eat. The most important thing you can do is drink enough fluid. You should drink enough fluid to keep your urine clear or pale yellow. The following guidelines provide specific information for the type of kidney stone you have had. Guidelines according to type of kidney stone Calcium Oxalate Kidney Stones  Reduce the amount of salt you eat. Foods that have a lot of salt cause your body to release excess calcium into your urine. The excess calcium can combine with a substance called oxalate to form kidney stones.  Reduce the amount of animal protein you eat if the amount you eat is excessive. Animal protein causes your body to release excess calcium into your urine. Ask your dietitian how much protein from animal sources you should be eating.  Avoid foods that are high in oxalates. If you take vitamins, they should have less than 500 mg of vitamin C. Your body turns vitamin C into oxalates. You do not need to avoid fruits and vegetables high in vitamin C. Calcium Phosphate Kidney Stones  Reduce the amount of salt you eat to help prevent the release of excess calcium into your urine.  Reduce the amount of animal protein you eat if the amount you eat is excessive. Animal protein causes your body to release excess calcium into your urine. Ask your dietitian how much protein from animal sources you should be eating.  Get enough calcium from food or take a calcium supplement (ask your dietitian for recommendations). Food sources of calcium that do not increase your risk of kidney stones include:  Broccoli.  Dairy products, such as cheese and yogurt.  Pudding. Uric Acid Kidney Stones  Do not have more than 6 oz of animal protein per day. Food sources Electrical engineerAnimal Protein Sources  Meat (all types).  Poultry.  Eggs.  Fish, seafood. Foods High in MirantSalt  Salt seasonings.  Soy  sauce.  Teriyaki sauce.  Cured and processed meats.  Salted crackers and snack foods.  Fast food.  Canned soups and most canned foods. Foods High in Oxalates  Grains:  Amaranth.  Barley.  Grits.  Wheat germ.  Bran.  Buckwheat flour.  All bran cereals.  Pretzels.  Whole wheat bread.  Vegetables:  Beans (wax).  Beets and beet greens.  Collard greens.  Eggplant.  Escarole.  Leeks.  Okra.  Parsley.  Rutabagas.  Spinach.  Swiss chard.  Tomato paste.  Fried potatoes.  Sweet potatoes.  Fruits:  Red currants.  Figs.  Kiwi.  Rhubarb.  Meat and Other Protein Sources:  Beans (dried).  Soy burgers and other soybean products.  Miso.  Nuts (peanuts, almonds, pecans, cashews, hazelnuts).  Nut butters.  Sesame seeds and tahini (paste made of sesame seeds).  Poppy seeds.  Beverages:  Chocolate drink mixes.  Soy milk.  Instant iced tea.  Juices made from high-oxalate fruits or vegetables.  Other:  Carob.  Chocolate.  Fruitcake.  Marmalades. This information is not intended to replace advice given to you by your health care provider. Make sure you discuss any questions you have with your health care provider. Document Released: 03/25/2011 Document Revised: 05/05/2016 Document Reviewed: 10/25/2013 Elsevier Interactive Patient Education  2017 Elsevier Inc.   Renal Colic Introduction Renal colic is pain that is caused by a kidney stone. Follow these instructions at home:  Take medicines only as told by your doctor.  Ask your doctor if  it is okay to take over-the-counter medicine for pain.  Drink enough fluid to keep your pee (urine) clear or pale yellow. Drink 6-8 glasses of water each day.  Eat less than 2 grams of salt per day.  Eat less protein. Some foods that have protein are meats, fish, nuts, and dairy.  Try not to eat spinach, rhubarb, nuts, or bran. Contact a doctor if:  You have a fever or  chills.  Your pee smells bad or looks cloudy.  You have pain or burning when you pee. Get help right away if:  The pain in your side (flank) or your groin suddenly gets worse.  You get confused.  You pass out. This information is not intended to replace advice given to you by your health care provider. Make sure you discuss any questions you have with your health care provider. Document Released: 05/16/2008 Document Revised: 05/05/2016 Document Reviewed: 10/08/2014  2017 Elsevier

## 2016-11-28 NOTE — Interval H&P Note (Signed)
History and Physical Interval Note:  11/28/2016 10:35 AM  Martha RidgeLoretta Migliaccio  has presented today for surgery, with the diagnosis of left ureteral pelvic junction stone  The various methods of treatment have been discussed with the patient and family. After consideration of risks, benefits and other options for treatment, the patient has consented to  Procedure(s): LEFT EXTRACORPOREAL SHOCK WAVE LITHOTRIPSY (ESWL) (Left) as a surgical intervention .  The patient's history has been reviewed, patient examined, no change in status, stable for surgery.  I have reviewed the patient's chart and labs.  Questions were answered to the patient's satisfaction.     Emilynn Srinivasan I Aldon Hengst

## 2016-11-30 ENCOUNTER — Ambulatory Visit: Payer: 59 | Admitting: Urology

## 2016-11-30 HISTORY — PX: LITHOTRIPSY: SUR834

## 2016-12-14 ENCOUNTER — Emergency Department (HOSPITAL_COMMUNITY)
Admission: EM | Admit: 2016-12-14 | Discharge: 2016-12-14 | Disposition: A | Discharge status: Home / Self Care | Payer: 59 | Attending: Emergency Medicine | Admitting: Emergency Medicine

## 2016-12-14 ENCOUNTER — Encounter (HOSPITAL_COMMUNITY): Payer: Self-pay | Admitting: Emergency Medicine

## 2016-12-14 ENCOUNTER — Emergency Department (HOSPITAL_COMMUNITY): Payer: 59

## 2016-12-14 DIAGNOSIS — Z79899 Other long term (current) drug therapy: Secondary | ICD-10-CM | POA: Insufficient documentation

## 2016-12-14 DIAGNOSIS — Z87891 Personal history of nicotine dependence: Secondary | ICD-10-CM | POA: Diagnosis not present

## 2016-12-14 DIAGNOSIS — R739 Hyperglycemia, unspecified: Secondary | ICD-10-CM | POA: Diagnosis not present

## 2016-12-14 DIAGNOSIS — R109 Unspecified abdominal pain: Secondary | ICD-10-CM | POA: Diagnosis present

## 2016-12-14 DIAGNOSIS — N200 Calculus of kidney: Secondary | ICD-10-CM | POA: Insufficient documentation

## 2016-12-14 LAB — BASIC METABOLIC PANEL
Anion gap: 7 (ref 5–15)
BUN: 16 mg/dL (ref 6–20)
CALCIUM: 9.1 mg/dL (ref 8.9–10.3)
CO2: 27 mmol/L (ref 22–32)
CREATININE: 0.93 mg/dL (ref 0.44–1.00)
Chloride: 102 mmol/L (ref 101–111)
GFR calc Af Amer: >60 mL/min (ref >60)
GLUCOSE: 195 mg/dL — AB (ref 65–99)
Potassium: 4.6 mmol/L (ref 3.5–5.1)
Sodium: 136 mmol/L (ref 135–145)

## 2016-12-14 LAB — CBC WITH DIFFERENTIAL/PLATELET
BASOS PCT: 0 %
Basophils Absolute: 0 10*3/uL (ref 0.0–0.1)
EOS PCT: 1 %
Eosinophils Absolute: 0.2 10*3/uL (ref 0.0–0.7)
HEMATOCRIT: 40.7 % (ref 36.0–46.0)
Hemoglobin: 13 g/dL (ref 12.0–15.0)
Lymphocytes Relative: 17 %
Lymphs Abs: 1.9 10*3/uL (ref 0.7–4.0)
MCH: 26.4 pg (ref 26.0–34.0)
MCHC: 31.9 g/dL (ref 30.0–36.0)
MCV: 82.6 fL (ref 78.0–100.0)
MONO ABS: 0.5 10*3/uL (ref 0.1–1.0)
MONOS PCT: 5 %
NEUTROS ABS: 8.6 10*3/uL — AB (ref 1.7–7.7)
Neutrophils Relative %: 77 %
PLATELETS: 312 10*3/uL (ref 150–400)
RBC: 4.93 MIL/uL (ref 3.87–5.11)
RDW: 13.4 % (ref 11.5–15.5)
WBC: 11.2 10*3/uL — ABNORMAL HIGH (ref 4.0–10.5)

## 2016-12-14 LAB — URINALYSIS, ROUTINE W REFLEX MICROSCOPIC
BACTERIA UA: NONE SEEN
Bilirubin Urine: NEGATIVE
Glucose, UA: NEGATIVE mg/dL
Ketones, ur: NEGATIVE mg/dL
Nitrite: NEGATIVE
PH: 5 (ref 5.0–8.0)
Protein, ur: NEGATIVE mg/dL
SPECIFIC GRAVITY, URINE: 1.021 (ref 1.005–1.030)

## 2016-12-14 LAB — PREGNANCY, URINE: Preg Test, Ur: NEGATIVE

## 2016-12-14 MED ORDER — OXYCODONE-ACETAMINOPHEN 5-325 MG PO TABS: 1.0000 | ORAL_TABLET | ORAL | 0 refills | Status: DC | PRN

## 2016-12-14 MED ORDER — KETOROLAC TROMETHAMINE 10 MG PO TABS: 10.0000 mg | ORAL_TABLET | Freq: Four times a day (QID) | ORAL | 0 refills | Status: DC | PRN

## 2016-12-14 MED ORDER — ONDANSETRON HCL 4 MG/2ML IJ SOLN
4.0000 mg | Freq: Once | INTRAMUSCULAR | Status: AC
  Administered 2016-12-14: 4 mg via INTRAVENOUS
  Filled 2016-12-14: qty 2

## 2016-12-14 MED ORDER — ONDANSETRON HCL 8 MG PO TABS: 8.0000 mg | ORAL_TABLET | ORAL | 0 refills | Status: DC | PRN

## 2016-12-14 MED ORDER — HYDROMORPHONE HCL 1 MG/ML IJ SOLN
1.0000 mg | Freq: Once | INTRAMUSCULAR | Status: AC
  Administered 2016-12-14: 1 mg via INTRAVENOUS
  Filled 2016-12-14: qty 1

## 2016-12-14 MED ORDER — SODIUM CHLORIDE 0.9 % IV BOLUS (SEPSIS)
500.0000 mL | Freq: Once | INTRAVENOUS | Status: AC
  Administered 2016-12-14: 500 mL via INTRAVENOUS

## 2016-12-14 MED ORDER — KETOROLAC TROMETHAMINE 30 MG/ML IJ SOLN
15.0000 mg | Freq: Once | INTRAMUSCULAR | Status: AC
  Administered 2016-12-14: 15 mg via INTRAVENOUS
  Filled 2016-12-14: qty 1

## 2016-12-14 MED ORDER — MORPHINE SULFATE (PF) 4 MG/ML IV SOLN
4.0000 mg | Freq: Once | INTRAVENOUS | Status: AC
  Administered 2016-12-14: 4 mg via INTRAVENOUS
  Filled 2016-12-14: qty 1

## 2016-12-14 MED ORDER — CEPHALEXIN 500 MG PO CAPS: 500.0000 mg | ORAL_CAPSULE | Freq: Four times a day (QID) | ORAL | 0 refills | Status: DC

## 2016-12-14 MED ORDER — KETOROLAC TROMETHAMINE 30 MG/ML IJ SOLN
30.0000 mg | Freq: Once | INTRAMUSCULAR | Status: AC
  Administered 2016-12-14: 30 mg via INTRAVENOUS
  Filled 2016-12-14: qty 1

## 2016-12-14 NOTE — ED Triage Notes (Signed)
0230 this morning, pt states having left flank pain. Hx of kidney stones. States no fever. Vomiting since this morning.

## 2016-12-14 NOTE — Discharge Instructions (Signed)
You have a small urine infection. There are still some residual stones in your kidney. Prescriptions for Percocet, antibiotic, Toradol.  Continue Flomax. Follow-up with urologist. Your glucose was elevated today

## 2016-12-14 NOTE — ED Provider Notes (Addendum)
[facility]-EMERGENCY DEPT Provider Note   CSN: [id number] Arrival date & time: 12/14/16  1058   By signing my name below, I, Martha Morgan, attest that this documentation has been prepared under the direction and in the presence of Donnetta Hutching, MD  Electronically Signed: Clovis Pu, ED Scribe. 12/14/16. 12:03 PM.   History   Chief Complaint Chief Complaint  Patient presents with  . Flank Pain   The history is provided by the patient. No language interpreter was used.   HPI Comments:  Martha Morgan is a 41 y.o. female, with a hx of kidney stones, who presents to the Emergency Department complaining of sudden onset, moderate left flank pain x today. Pt also reports nausea and vomiting. No alleviating factors noted. Pt reports she had a lithotripsy on 11/30/16. Pt denies any other associated symptoms and any other modifying factors at this time.     Past Medical History:  Diagnosis Date  . Gestational diabetes mellitus in pregnancy, insulin controlled   . History of kidney stones   . History of pregnancy induced hypertension   . Obesity     Patient Active Problem List   Diagnosis Date Noted  . Acute diastolic heart failure: volume overload, normal EF, but hypoxia. 08/19/2014  . Sinus tachycardia 08/18/2014  . Hypoxia 08/18/2014  . Pre-eclampsia, delivered 08/18/2014  . History of pregnancy induced hypertension   . Gestational diabetes mellitus in pregnancy, insulin controlled   . Obesity     Past Surgical History:  Procedure Laterality Date  . CESAREAN SECTION    . LITHOTRIPSY  11/30/2016  . RECTOVAGINAL FISTULA CLOSURE      OB History    Gravida Para Term Preterm AB Living   4 2     2 2    SAB TAB Ectopic Multiple Live Births                  Obstetric Comments   Delivery at [facility] [birth year]       Home Medications    Prior to Admission medications   Medication Sig Start Date End Date Taking? Authorizing Provider  ibuprofen (ADVIL,MOTRIN) 200 MG  tablet Take 400 mg by mouth every 6 (six) hours as needed for moderate pain.   Yes Historical Provider, MD  promethazine (PHENERGAN) 25 MG tablet Take 1 tablet by mouth daily as needed for nausea/vomiting. 11/25/16  Yes Historical Provider, MD  traMADol (ULTRAM) 50 MG tablet Take 50 mg by mouth every 6 (six) hours as needed for moderate pain.    Yes Historical Provider, MD  cephALEXin (KEFLEX) 500 MG capsule Take 1 capsule (500 mg total) by mouth 4 (four) times daily. 12/14/16   Donnetta Hutching, MD  ketorolac (TORADOL) 10 MG tablet Take 1 tablet (10 mg total) by mouth every 6 (six) hours as needed. 12/14/16   Donnetta Hutching, MD  ondansetron (ZOFRAN) 8 MG tablet Take 1 tablet (8 mg total) by mouth every 4 (four) hours as needed. 12/14/16   Donnetta Hutching, MD  oxyCODONE-acetaminophen (PERCOCET) 5-325 MG tablet Take 1-2 tablets by mouth every 4 (four) hours as needed. 12/14/16   Donnetta Hutching, MD    Family History History reviewed. No pertinent family history.  Social History Social History  Substance Use Topics  . Smoking status: Former Games developer  . Smokeless tobacco: Never Used  . Alcohol use No     Allergies   Patient has no known allergies.   Review of Systems Review of Systems  Gastrointestinal: Positive for nausea  and vomiting.  Genitourinary: Positive for flank pain.  All other systems reviewed and are negative.   Physical Exam Updated Vital Signs BP (!) 146/106 (BP Location: Right Arm)   Pulse 101   Temp 97.5 F (36.4 C) (Oral)   Resp 18   Ht 5\' 6"  (1.676 m)   Wt 215 lb (97.5 kg)   LMP 12/03/2016 Comment: neg u preg  SpO2 99%   BMI 34.70 kg/m   Physical Exam  Constitutional: She is oriented to person, place, and time. She appears well-developed and well-nourished.  HENT:  Head: Normocephalic and atraumatic.  Eyes: Conjunctivae are normal.  Neck: Neck supple.  Cardiovascular: Normal rate and regular rhythm.   Pulmonary/Chest: Effort normal and breath sounds normal.  Abdominal: Soft.  Bowel sounds are normal.  Musculoskeletal: Normal range of motion. She exhibits tenderness.  Left flank tenderness   Neurological: She is alert and oriented to person, place, and time.  Skin: Skin is warm and dry.  Psychiatric: She has a normal mood and affect. Her behavior is normal.  Nursing note and vitals reviewed.   ED Treatments / Results  DIAGNOSTIC STUDIES:  Oxygen Saturation is 99% on RA, normal by my interpretation.    COORDINATION OF CARE:  11:47 AM Discussed treatment plan with pt at bedside and pt agreed to plan.  Labs (all labs ordered are listed, but only abnormal results are displayed) Labs Reviewed  CBC WITH DIFFERENTIAL/PLATELET - Abnormal; Notable for the following:       Result Value   WBC 11.2 (*)    Neutro Abs 8.6 (*)    All other components within normal limits  BASIC METABOLIC PANEL - Abnormal; Notable for the following:    Glucose, Bld 195 (*)    All other components within normal limits  URINALYSIS, ROUTINE W REFLEX MICROSCOPIC - Abnormal; Notable for the following:    APPearance HAZY (*)    Hgb urine dipstick SMALL (*)    Leukocytes, UA TRACE (*)    All other components within normal limits  PREGNANCY, URINE    EKG  EKG Interpretation None       Radiology Dg Abdomen 1 View  Result Date: 12/14/2016 CLINICAL DATA:  Left side flank pain, recent lithotripsy EXAM: ABDOMEN - 1 VIEW COMPARISON:  11/28/2016 FINDINGS: There is normal small bowel gas pattern. Again noted at least 2 calcified calculi in lower pole region of the left kidney the largest measures 4 mm. No definite ureteral calculi are identified. Stable left pelvic phleboliths. IMPRESSION: Left nephrolithiasis again noted. No definite ureteral calculi are identified. Electronically Signed   By: Natasha MeadLiviu  Pop M.D.   On: 12/14/2016 12:30    Procedures Procedures (including critical care time)  Medications Ordered in ED Medications  sodium chloride 0.9 % bolus 500 mL (0 mLs Intravenous  Stopped 12/14/16 1457)  ondansetron (ZOFRAN) injection 4 mg (4 mg Intravenous Given 12/14/16 1204)  HYDROmorphone (DILAUDID) injection 1 mg (1 mg Intravenous Given 12/14/16 1205)  ketorolac (TORADOL) 30 MG/ML injection 30 mg (30 mg Intravenous Given 12/14/16 1205)  morphine 4 MG/ML injection 4 mg (4 mg Intravenous Given 12/14/16 1458)  ketorolac (TORADOL) 30 MG/ML injection 15 mg (15 mg Intravenous Given 12/14/16 1457)     Initial Impression / Assessment and Plan / ED Course  I have reviewed the triage vital signs and the nursing notes.  Pertinent labs & imaging results that were available during my care of the patient were reviewed by me and considered in my medical  decision making (see chart for details).  Clinical Course     Patient is hemodynamically stable. She feels better after pain management. KUB reveals no ureteral stones, but evidence of nephrolithiasis.  Glucose elevated. These findings were discussed with the patient. Discharge medications Toradol 10 mg, Percocet, Keflex 500 mg. She'll continue her Flomax. Urine culture pending. Follow-up with urology  Final Clinical Impressions(s) / ED Diagnoses   Final diagnoses:  Kidney stone on left side  Hyperglycemia    New Prescriptions New Prescriptions   CEPHALEXIN (KEFLEX) 500 MG CAPSULE    Take 1 capsule (500 mg total) by mouth 4 (four) times daily.   KETOROLAC (TORADOL) 10 MG TABLET    Take 1 tablet (10 mg total) by mouth every 6 (six) hours as needed.   ONDANSETRON (ZOFRAN) 8 MG TABLET    Take 1 tablet (8 mg total) by mouth every 4 (four) hours as needed.   OXYCODONE-ACETAMINOPHEN (PERCOCET) 5-325 MG TABLET    Take 1-2 tablets by mouth every 4 (four) hours as needed.  I personally performed the services described in this documentation, which was scribed in my presence. The recorded information has been reviewed and is accurate.     Donnetta Hutching, MD 12/14/16 1511    Donnetta Hutching, MD 12/14/16 [phone]

## 2016-12-16 LAB — URINE CULTURE

## 2016-12-21 ENCOUNTER — Ambulatory Visit (INDEPENDENT_AMBULATORY_CARE_PROVIDER_SITE_OTHER): Payer: Self-pay | Admitting: Urology

## 2016-12-21 DIAGNOSIS — N200 Calculus of kidney: Secondary | ICD-10-CM

## 2017-01-04 ENCOUNTER — Ambulatory Visit: Payer: 59 | Admitting: Urology

## 2017-01-25 ENCOUNTER — Ambulatory Visit: Payer: 59 | Admitting: Urology

## 2017-03-14 ENCOUNTER — Ambulatory Visit: Payer: 59 | Admitting: Urology

## 2017-04-19 ENCOUNTER — Ambulatory Visit (HOSPITAL_COMMUNITY)
Admission: RE | Admit: 2017-04-19 | Discharge: 2017-04-19 | Disposition: A | Discharge status: Home / Self Care | Payer: 59 | Source: Ambulatory Visit | Attending: Urology | Admitting: Urology

## 2017-04-19 ENCOUNTER — Ambulatory Visit (INDEPENDENT_AMBULATORY_CARE_PROVIDER_SITE_OTHER): Payer: 59 | Admitting: Urology

## 2017-04-19 ENCOUNTER — Other Ambulatory Visit: Payer: Self-pay | Admitting: Urology

## 2017-04-19 DIAGNOSIS — N200 Calculus of kidney: Secondary | ICD-10-CM

## 2017-08-30 ENCOUNTER — Other Ambulatory Visit (HOSPITAL_COMMUNITY): Payer: Self-pay | Admitting: Physician Assistant

## 2017-08-30 DIAGNOSIS — Z1231 Encounter for screening mammogram for malignant neoplasm of breast: Secondary | ICD-10-CM

## 2017-09-06 ENCOUNTER — Ambulatory Visit: Payer: 59 | Admitting: Urology

## 2017-09-14 ENCOUNTER — Ambulatory Visit (HOSPITAL_COMMUNITY)
Admission: RE | Admit: 2017-09-14 | Discharge: 2017-09-14 | Disposition: A | Discharge status: Home / Self Care | Payer: BLUE CROSS/BLUE SHIELD | Source: Ambulatory Visit | Attending: Physician Assistant | Admitting: Physician Assistant

## 2017-09-14 ENCOUNTER — Encounter (HOSPITAL_COMMUNITY): Payer: Self-pay

## 2017-09-14 DIAGNOSIS — Z1231 Encounter for screening mammogram for malignant neoplasm of breast: Secondary | ICD-10-CM | POA: Insufficient documentation

## 2017-10-18 ENCOUNTER — Ambulatory Visit: Payer: BLUE CROSS/BLUE SHIELD | Admitting: Urology

## 2017-12-13 ENCOUNTER — Ambulatory Visit: Payer: BLUE CROSS/BLUE SHIELD | Admitting: Urology

## 2019-05-16 ENCOUNTER — Other Ambulatory Visit (HOSPITAL_COMMUNITY): Payer: Self-pay | Admitting: Physician Assistant

## 2019-05-16 DIAGNOSIS — Z1231 Encounter for screening mammogram for malignant neoplasm of breast: Secondary | ICD-10-CM

## 2019-05-16 DIAGNOSIS — E1129 Type 2 diabetes mellitus with other diabetic kidney complication: Secondary | ICD-10-CM | POA: Insufficient documentation

## 2019-05-29 ENCOUNTER — Ambulatory Visit (HOSPITAL_COMMUNITY)
Admission: RE | Admit: 2019-05-29 | Discharge: 2019-05-29 | Disposition: A | Discharge status: Home / Self Care | Payer: BC Managed Care – PPO | Source: Ambulatory Visit | Attending: Physician Assistant | Admitting: Physician Assistant

## 2019-05-29 ENCOUNTER — Other Ambulatory Visit: Payer: Self-pay

## 2019-05-29 DIAGNOSIS — Z1231 Encounter for screening mammogram for malignant neoplasm of breast: Secondary | ICD-10-CM

## 2019-09-05 ENCOUNTER — Other Ambulatory Visit: Payer: Self-pay | Admitting: *Deleted

## 2019-09-05 DIAGNOSIS — Z20822 Contact with and (suspected) exposure to covid-19: Secondary | ICD-10-CM

## 2019-09-06 LAB — NOVEL CORONAVIRUS, NAA: SARS-CoV-2, NAA: NOT DETECTED

## 2019-09-09 ENCOUNTER — Telehealth: Payer: Self-pay

## 2019-09-09 NOTE — Telephone Encounter (Signed)
Per pt's. Request, faxed COVID test results to [place] at [phone], Attention [facility].    Called pt. and left vm that the fax had been sent to the [facility], per her request.

## 2019-10-09 ENCOUNTER — Other Ambulatory Visit: Payer: Self-pay | Admitting: *Deleted

## 2019-10-09 DIAGNOSIS — Z20822 Contact with and (suspected) exposure to covid-19: Secondary | ICD-10-CM

## 2019-10-10 LAB — NOVEL CORONAVIRUS, NAA: SARS-CoV-2, NAA: NOT DETECTED

## 2020-04-03 ENCOUNTER — Ambulatory Visit: Payer: BC Managed Care – PPO | Attending: Internal Medicine

## 2020-04-03 DIAGNOSIS — Z23 Encounter for immunization: Secondary | ICD-10-CM

## 2020-04-03 NOTE — Progress Notes (Signed)
   Covid-19 Vaccination Clinic  Name:  Martha Morgan    MRN: [id number] DOB: [birth year]  04/03/2020  [name] was observed post Covid-19 immunization for 15 minutes without incident. She was provided with Vaccine Information Sheet and instruction to access the V-Safe system.   [name] was instructed to call 911 with any severe reactions post vaccine: Marland Kitchen Difficulty breathing  . Swelling of face and throat  . A fast heartbeat  . A bad rash all over body  . Dizziness and weakness   Immunizations Administered    Name Date Dose VIS Date Route   Moderna COVID-19 Vaccine 04/03/2020 11:17 AM 0.5 mL 11/2019 Intramuscular   Manufacturer: Moderna   Lot: [id number]   NDC: 46270-350-09

## 2020-05-07 ENCOUNTER — Other Ambulatory Visit: Payer: Self-pay

## 2020-05-07 ENCOUNTER — Ambulatory Visit: Payer: BC Managed Care – PPO | Attending: Internal Medicine

## 2020-05-07 DIAGNOSIS — Z23 Encounter for immunization: Secondary | ICD-10-CM

## 2020-05-07 NOTE — Progress Notes (Signed)
   Covid-19 Vaccination Clinic  Name:  Martha Morgan    MRN: [id number] DOB: [birth year]  05/07/2020  Martha Morgan was observed post Covid-19 immunization for 15 minutes without incident. She was provided with Vaccine Information Sheet and instruction to access the V-Safe system.   Martha Morgan was instructed to call 911 with any severe reactions post vaccine: Marland Kitchen Difficulty breathing  . Swelling of face and throat  . A fast heartbeat  . A bad rash all over body  . Dizziness and weakness   Immunizations Administered    Name Date Dose VIS Date Route   Moderna COVID-19 Vaccine 05/07/2020  9:24 AM 0.5 mL 11/2019 Intramuscular   Manufacturer: Moderna   Lot: [id number]   NDC: 54492-010-07

## 2020-07-16 DIAGNOSIS — E1129 Type 2 diabetes mellitus with other diabetic kidney complication: Secondary | ICD-10-CM | POA: Diagnosis not present

## 2020-07-16 DIAGNOSIS — R809 Proteinuria, unspecified: Secondary | ICD-10-CM | POA: Diagnosis not present

## 2020-09-19 IMAGING — MG DIGITAL SCREENING BILATERAL MAMMOGRAM WITH TOMO AND CAD
8 series · 8 of 24 positions shown · non-contrast
Comparison: Previous exam(s).

CLINICAL DATA: Screening.

EXAM:
DIGITAL SCREENING BILATERAL MAMMOGRAM WITH TOMO AND CAD

[L CC synth-2D]
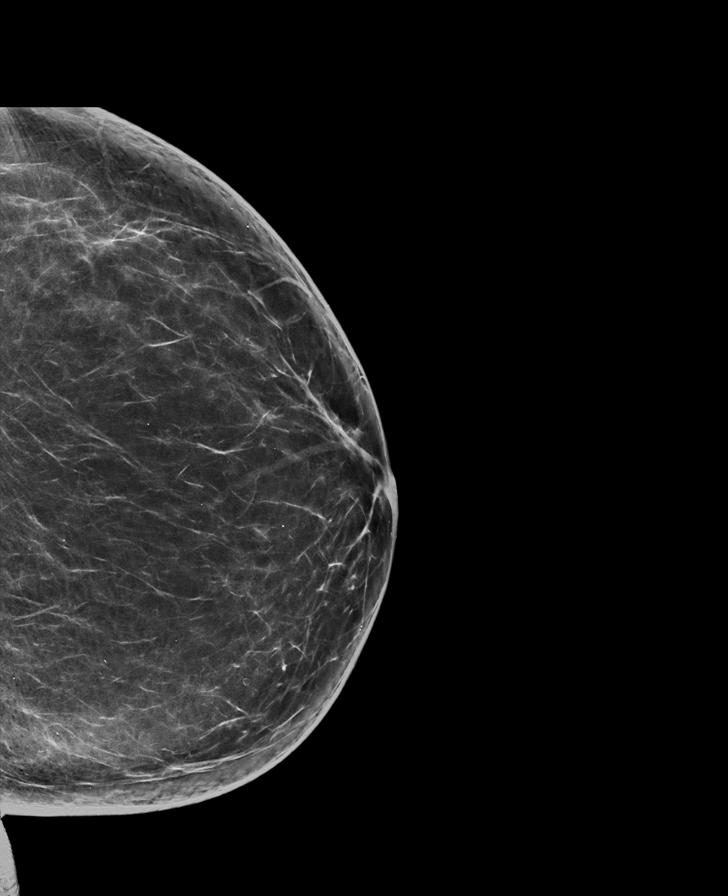

[L MLO synth-2D]
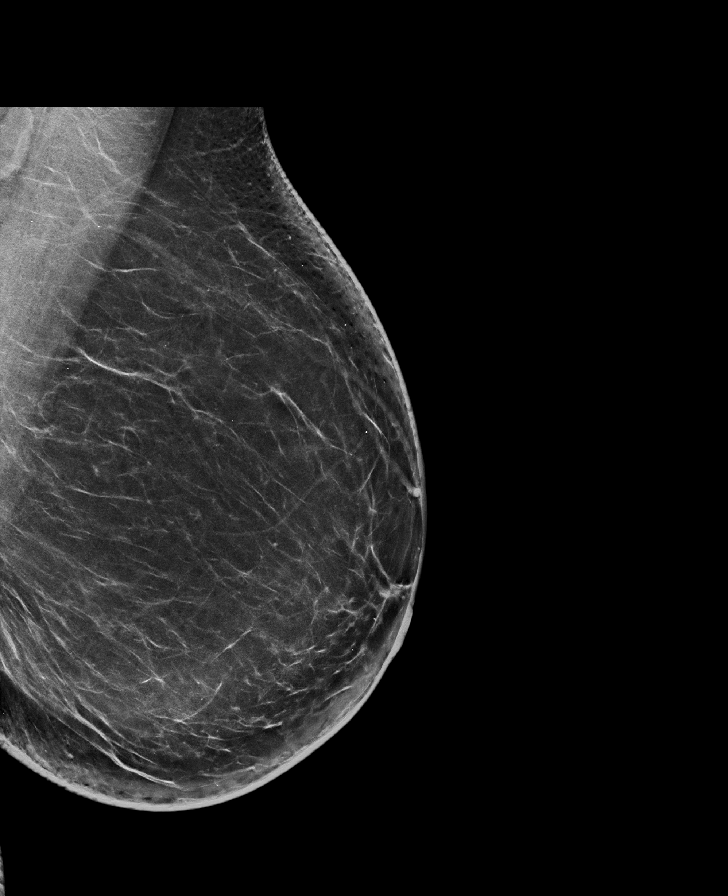

[R CC synth-2D]
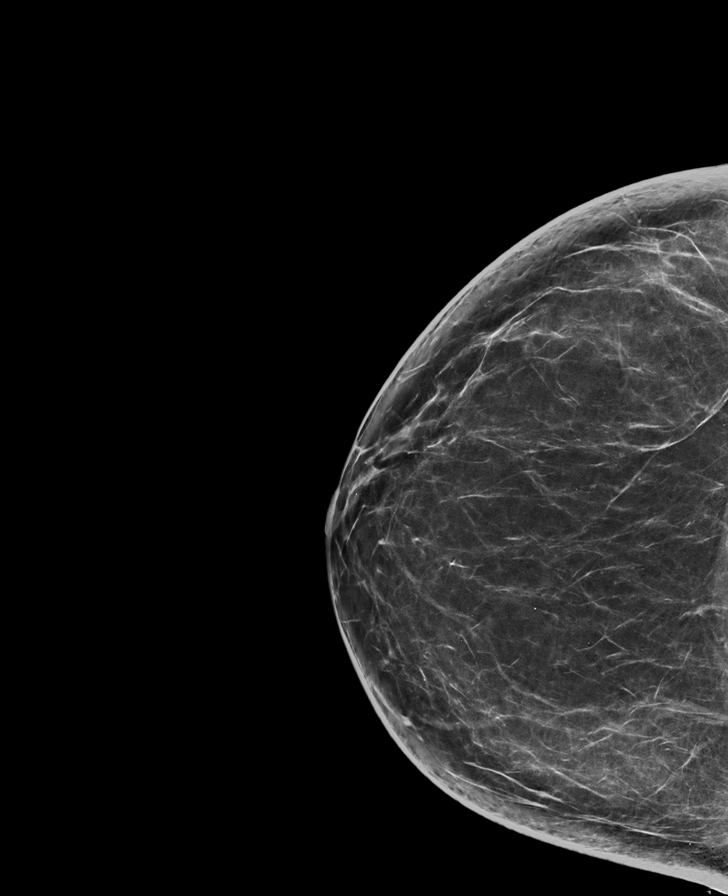

[R MLO synth-2D]
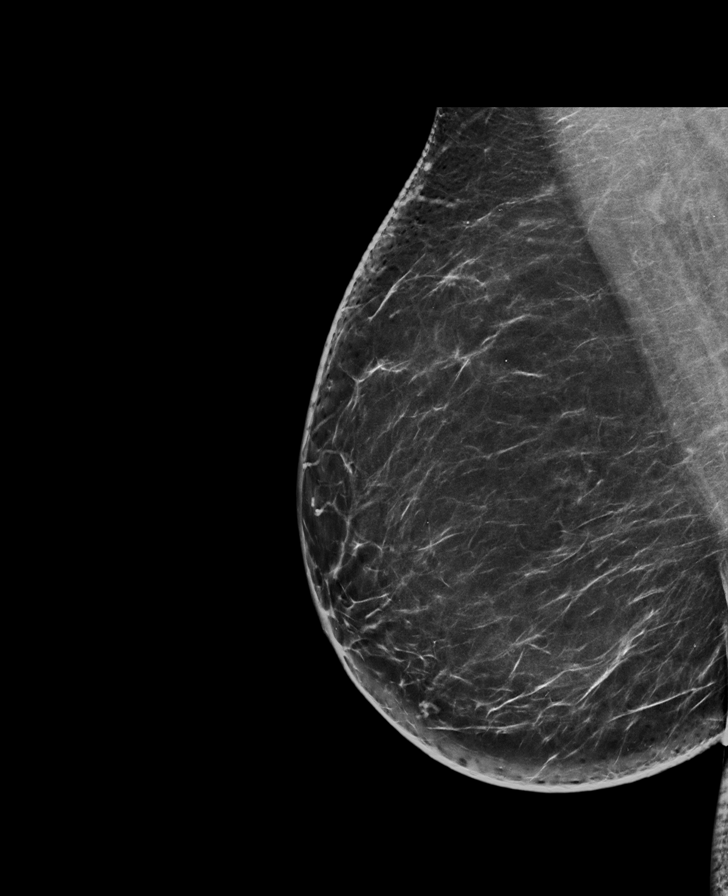

[L MLO tomo · tomo slice 47/92.0]
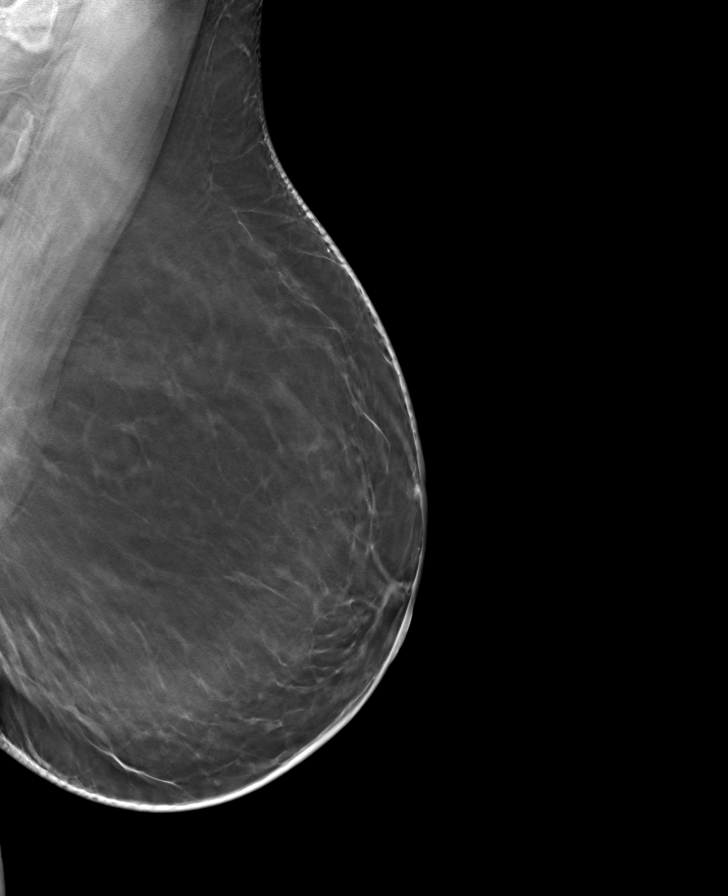

[R MLO tomo · tomo slice 45/90.0]
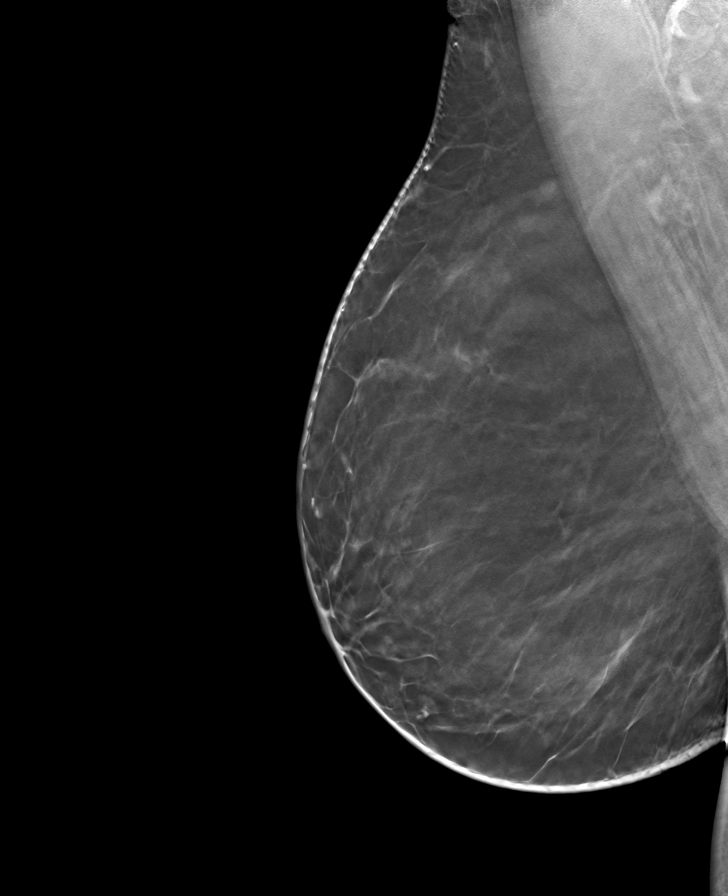

[L CC tomo · tomo slice 41/81.0]
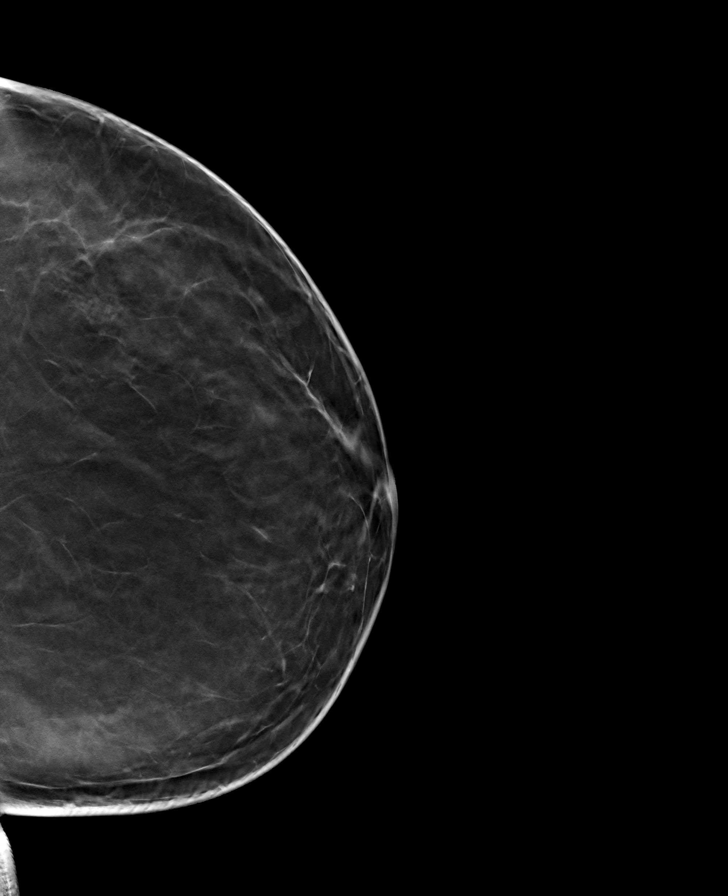

[R CC tomo · tomo slice 42/83.0]
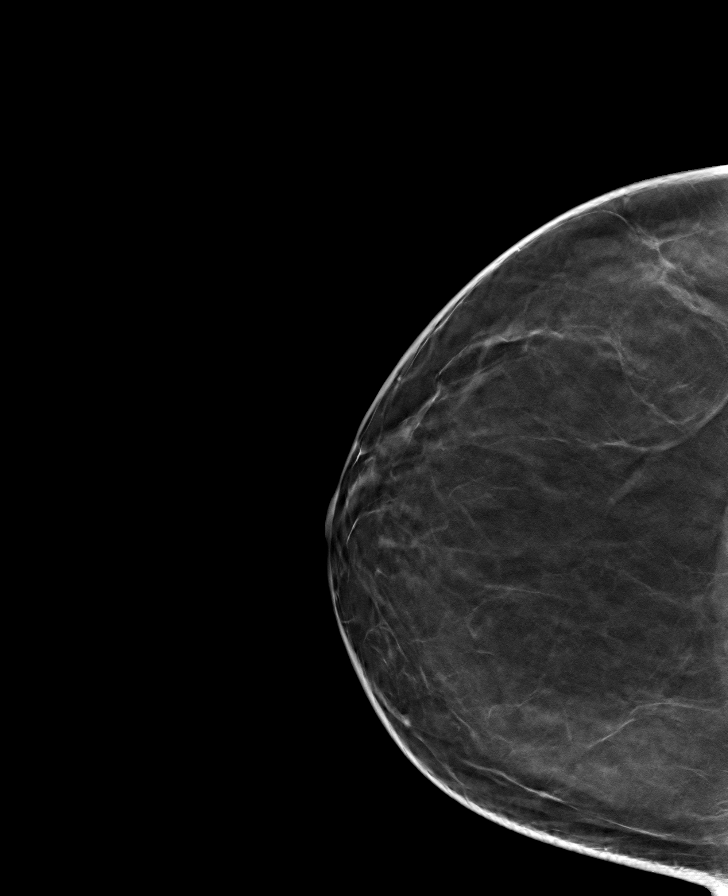

[8 of 24 positions shown; findings below may reference images not displayed]

ACR Breast Density Category b: There are scattered areas of
fibroglandular density.
FINDINGS: There are no findings suspicious for malignancy. Images were
processed with CAD.
IMPRESSION: No mammographic evidence of malignancy. A result letter of this
screening mammogram will be mailed directly to the patient.

RECOMMENDATION:
Screening mammogram in one year. (Code:[id number]-775)

BI-RADS CATEGORY  1: Negative.

## 2022-02-15 ENCOUNTER — Encounter: Payer: Self-pay | Admitting: Nurse Practitioner

## 2022-02-15 ENCOUNTER — Ambulatory Visit: Payer: BC Managed Care – PPO | Admitting: Nurse Practitioner

## 2022-02-15 VITALS — BP 133/88 | HR 108 | Temp 98.3°F | Ht 66.0 in | Wt 210.0 lb

## 2022-02-15 DIAGNOSIS — E119 Type 2 diabetes mellitus without complications: Secondary | ICD-10-CM | POA: Diagnosis not present

## 2022-02-15 DIAGNOSIS — Z7689 Persons encountering health services in other specified circumstances: Secondary | ICD-10-CM | POA: Diagnosis not present

## 2022-02-15 LAB — BAYER DCA HB A1C WAIVED: HB A1C (BAYER DCA - WAIVED): 8.5 % — ABNORMAL HIGH (ref 4.8–5.6)

## 2022-02-15 NOTE — Progress Notes (Signed)
? ?New Patient Note ? ?RE: Martha Morgan MRN: [id number] DOB: [birth year] ?Date of Office Visit: 02/15/2022 ? ?Chief Complaint: New Patient (Initial Visit) (Chronic disease management) ? ?History of Present Illness: ? ?Diabetes Mellitus Type II, Initial Visit: Patient here for an initial evaluation of Type 2 diabetes mellitus.  Current symptoms/problems include polyuria and have been unchanged. Symptoms have been present for a few years. ? ?The patient was initially diagnosed with Type 2 diabetes mellitus based on the following criteria: Elevated blood glucose and A1c above 6.7. ? ?Known diabetic complications: none ?Cardiovascular risk factors: obesity (BMI >= 30 kg/m2) ?Current diabetic medications include none.  ? ?Eye exam current (within one year): no ?Weight trend: fluctuating a bit ?Prior visit with dietician: no ?Current diet: in general, a "healthy" diet   ?Current exercise: none ? ?Current monitoring regimen: none ?Home blood sugar records: fasting range: 202 ?Any episodes of hypoglycemia? no ? ?Is She on ACE inhibitor or angiotensin II receptor blocker?  ?No  ?  ?  ? ?Assessment and Plan: ?Martha Morgan is a 46 y.o. female with: ?Diabetes mellitus without complication (HCC) ?Patient is new to practice establishing care with a history of diabetes mellitus without complication.  Patient is not currently on any medication.  Last follow-up few months 1 years ago.  Completed assessment, labs completed CBC, CMP lipid panel and A1c.  Education provided to patient printed handouts given.  Patient knows to follow-up in 3 months. ?Encourage diabetic diet, exercise, weight loss, ? ?Encounter to establish care ?Patient is new establishing care with a history of diabetes mellitus 2. ? ?Return in about 3 months (around 05/18/2022) for Diabetes. ? ? ?Diagnostics: ? ? ?Past Medical History: ?Patient Active Problem List  ? Diagnosis Date Noted  ? Diabetes mellitus without complication (HCC) 02/15/2022  ? Encounter to establish care  02/15/2022  ? Controlled type 2 diabetes mellitus with microalbuminuria, without long-term current use of insulin (HCC) 05/16/2019  ? Acute diastolic heart failure: volume overload, normal EF, but hypoxia. 08/19/2014  ? Sinus tachycardia 08/18/2014  ? Hypoxia 08/18/2014  ? Pre-eclampsia, delivered 08/18/2014  ? History of pregnancy induced hypertension   ? Gestational diabetes mellitus in pregnancy, insulin controlled   ? Obesity   ? Cesarean delivery delivered 08/14/2014  ? ?Past Medical History:  ?Diagnosis Date  ? Gestational diabetes mellitus in pregnancy, insulin controlled   ? History of kidney stones   ? History of pregnancy induced hypertension   ? Obesity   ? ?Past Surgical History: ?Past Surgical History:  ?Procedure Laterality Date  ? CESAREAN SECTION    ? LITHOTRIPSY  11/30/2016  ? RECTOVAGINAL FISTULA CLOSURE    ? ?Medication List:  ?No current outpatient medications on file.  ? ?No current facility-administered medications for this visit.  ? ?Allergies: ?No Known Allergies ?Social History: ?Social History  ? ?Socioeconomic History  ? Marital status: Divorced  ?  Spouse name: Not on file  ? Number of children: Not on file  ? Years of education: Not on file  ? Highest education level: Not on file  ?Occupational History  ? Not on file  ?Tobacco Use  ? Smoking status: Former  ? Smokeless tobacco: Never  ?Substance and Sexual Activity  ? Alcohol use: No  ? Drug use: No  ? Sexual activity: Yes  ?  Birth control/protection: None  ?Other Topics Concern  ? Not on file  ?Social History Narrative  ? Not on file  ? ?Social Determinants of Health  ? ?Financial Resource Strain:  Not on file  ?Food Insecurity: Not on file  ?Transportation Needs: Not on file  ?Physical Activity: Not on file  ?Stress: Not on file  ?Social Connections: Not on file  ? ? ? ? ? ?Family History: ?Family History  ?Problem Relation Age of Onset  ? COPD Mother   ? Hypertension Mother   ? Heart disease Mother   ? Cancer Father   ? Deep vein  thrombosis Father   ? Breast cancer Cousin 23  ?     Paternal cousin  ? ?     ? ?Review of Systems  ?HENT: Negative.    ?Eyes: Negative.   ?Respiratory: Negative.    ?Cardiovascular: Negative.   ?Gastrointestinal: Negative.   ?Genitourinary: Negative.   ?All other systems reviewed and are negative. ?Objective: ?BP 133/88   Pulse (!) 108   Temp 98.3 ?F (36.8 ?C)   Ht 5\' 6"  (1.676 m)   Wt 210 lb (95.3 kg)   LMP 02/15/2022 (Exact Date)   SpO2 96%   BMI 33.89 kg/m?  ?Body mass index is 33.89 kg/m?Marland Kitchen ?Physical Exam ?Vitals and nursing note reviewed.  ?Constitutional:   ?   Appearance: She is obese.  ?HENT:  ?   Head: Normocephalic.  ?   Right Ear: External ear normal.  ?   Left Ear: External ear normal.  ?   Nose: Nose normal.  ?   Mouth/Throat:  ?   Mouth: Mucous membranes are moist.  ?   Pharynx: Oropharynx is clear.  ?Eyes:  ?   Conjunctiva/sclera: Conjunctivae normal.  ?Cardiovascular:  ?   Rate and Rhythm: Normal rate and regular rhythm.  ?   Pulses: Normal pulses.  ?   Heart sounds: Normal heart sounds.  ?Pulmonary:  ?   Effort: Pulmonary effort is normal.  ?   Breath sounds: Normal breath sounds.  ?Abdominal:  ?   General: Bowel sounds are normal.  ?Skin: ?   General: Skin is warm.  ?   Findings: No rash.  ?Neurological:  ?   Mental Status: She is alert and oriented to person, place, and time.  ?Psychiatric:     ?   Behavior: Behavior normal.  ? ?The plan was reviewed with the patient/family, and all questions/concerned were addressed. ? ?It was my pleasure to see Martha Morgan today and participate in her care. Please feel free to contact me with any questions or concerns. ? ?Sincerely, ? ?Jac Canavan NP ?[place] ?

## 2022-02-15 NOTE — Patient Instructions (Signed)
Diabetes Mellitus Basics  Diabetes mellitus, or diabetes, is a long-term (chronic) disease. It occurs when the body does not properly use sugar (glucose) that is released from food after you eat. Diabetes mellitus may be caused by one or both of these problems: Your pancreas does not make enough of a hormone called insulin. Your body does not react in a normal way to the insulin that it makes. Insulin lets glucose enter cells in your body. This gives you energy. If you have diabetes, glucose cannot get into cells. This causes high blood glucose (hyperglycemia). How to treat and manage diabetes You may need to take insulin or other diabetes medicines daily to keep your glucose in balance. If you are prescribed insulin, you will learn how to give yourself insulin by injection. You may need to adjust the amount of insulin youtake based on the foods that you eat. You will need to check your blood glucose levels using a glucose monitor as told by your health care provider. The readings can help determine if you havelow or high blood glucose. Generally, you should have these blood glucose levels: Before meals (preprandial): 80-130 mg/dL (4.4-7.2 mmol/L). After meals (postprandial): below 180 mg/dL (10 mmol/L). Hemoglobin A1c (HbA1c) level: less than 7%. Your health care provider will set treatment goals for you. Keep all follow-up visits. This is important. Follow these instructions at home: Diabetes medicines Take your diabetes medicines every day as told by your health care provider. List your diabetes medicines here: Name of medicine: ______________________________ Amount (dose): _______________ Time (a.m./p.m.): _______________ Notes: ___________________________________ Name of medicine: ______________________________ Amount (dose): _______________ Time (a.m./p.m.): _______________ Notes: ___________________________________ Name of medicine: ______________________________ Amount (dose):  _______________ Time (a.m./p.m.): _______________ Notes: ___________________________________ Insulin If you use insulin, list the types of insulin you use here: Insulin type: ______________________________ Amount (dose): _______________ Time (a.m./p.m.): _______________Notes: ___________________________________ Insulin type: ______________________________ Amount (dose): _______________ Time (a.m./p.m.): _______________ Notes: ___________________________________ Insulin type: ______________________________ Amount (dose): _______________ Time (a.m./p.m.): _______________ Notes: ___________________________________ Insulin type: ______________________________ Amount (dose): _______________ Time (a.m./p.m.): _______________ Notes: ___________________________________ Insulin type: ______________________________ Amount (dose): _______________ Time (a.m./p.m.): _______________ Notes: ___________________________________ Managing blood glucose  Check your blood glucose levels using a glucose monitor as told by your healthcare provider. Write down the times that you check your glucose levels here: Time: _______________ Notes: ___________________________________ Time: _______________ Notes: ___________________________________ Time: _______________ Notes: ___________________________________ Time: _______________ Notes: ___________________________________ Time: _______________ Notes: ___________________________________ Time: _______________ Notes: ___________________________________  Low blood glucose Low blood glucose (hypoglycemia) is when glucose is at or below 70 mg/dL (3.9 mmol/L). Symptoms may include: Feeling: Hungry. Sweaty and clammy. Irritable or easily upset. Dizzy. Sleepy. Having: A fast heartbeat. A headache. A change in your vision. Numbness around the mouth, lips, or tongue. Having trouble with: Moving (coordination). Sleeping. Treating low blood glucose To treat low blood  glucose, eat or drink something containing sugar right away. If you can think clearly and swallow safely, follow the 15:15 rule: Take 15 grams of a fast-acting carb (carbohydrate), as told by your health care provider. Some fast-acting carbs are: Glucose tablets: take 3-4 tablets. Hard candy: eat 3-5 pieces. Fruit juice: drink 4 oz (120 mL). Regular (not diet) soda: drink 4-6 oz (120-180 mL). Honey or sugar: eat 1 Tbsp (15 mL). Check your blood glucose levels 15 minutes after you take the carb. If your glucose is still at or below 70 mg/dL (3.9 mmol/L), take 15 grams of a carb again. If your glucose does not go above 70 mg/dL (3.9 mmol/L) after 3 tries, get   help right away. After your glucose goes back to normal, eat a meal or a snack within 1 hour. Treating very low blood glucose If your glucose is at or below 54 mg/dL (3 mmol/L), you have very low blood glucose (severe hypoglycemia). This is an emergency. Do not wait to see if the symptoms will go away. Get medical help right away. Call your local emergency services (911 in the [place] Do not drive yourself to the hospital. Questions to ask your health care provider Should I talk with a diabetes educator? What equipment will I need to care for myself at home? What diabetes medicines do I need? When should I take them? How often do I need to check my blood glucose levels? What number can I call if I have questions? When is my follow-up visit? Where can I find a support group for people with diabetes? Where to find more information American Diabetes Association: www.diabetes.org Association of Diabetes [employer] and Education Specialists: www.diabeteseducator.org Contact a health care provider if: Your blood glucose is at or above 240 mg/dL (13.3 mmol/L) for 2 days in a row. You have been sick or have had a fever for 2 days or more, and you are not getting better. You have any of these problems for more than 6 hours: You cannot eat or  drink. You feel nauseous. You vomit. You have diarrhea. Get help right away if: Your blood glucose is lower than 54 mg/dL (3 mmol/L). You get confused. You have trouble thinking clearly. You have trouble breathing. These symptoms may represent a serious problem that is an emergency. Do not wait to see if the symptoms will go away. Get medical help right away. Call your local emergency services (911 in the [place] Do not drive yourself to the hospital. Summary Diabetes mellitus is a chronic disease that occurs when the body does not properly use sugar (glucose) that is released from food after you eat. Take insulin and diabetes medicines as told. Check your blood glucose every day, as often as told. Keep all follow-up visits. This is important. This information is not intended to replace advice given to you by your health care provider. Make sure you discuss any questions you have with your healthcare provider. Document Revised: 03/31/2020 Document Reviewed: 03/31/2020 [employer] Patient Education  2022 [employer]  

## 2022-02-15 NOTE — Assessment & Plan Note (Signed)
Patient is new to practice establishing care with a history of diabetes mellitus without complication.  Patient is not currently on any medication.  Last follow-up few months 1 years ago.  Completed assessment, labs completed CBC, CMP lipid panel and A1c.  Education provided to patient printed handouts given.  Patient knows to follow-up in 3 months. ?Encourage diabetic diet, exercise, weight loss, ?

## 2022-02-15 NOTE — Assessment & Plan Note (Signed)
Patient is new establishing care with a history of diabetes mellitus 2. ?

## 2022-02-16 ENCOUNTER — Other Ambulatory Visit: Payer: Self-pay | Admitting: Nurse Practitioner

## 2022-02-16 DIAGNOSIS — E119 Type 2 diabetes mellitus without complications: Secondary | ICD-10-CM

## 2022-02-16 LAB — CBC WITH DIFFERENTIAL/PLATELET
Basophils Absolute: 0.1 10*3/uL (ref 0.0–0.2)
Basos: 1 %
EOS (ABSOLUTE): 0.1 10*3/uL (ref 0.0–0.4)
Eos: 1 %
Hematocrit: 42.7 % (ref 34.0–46.6)
Hemoglobin: 13.7 g/dL (ref 11.1–15.9)
Immature Grans (Abs): 0 10*3/uL (ref 0.0–0.1)
Immature Granulocytes: 0 %
Lymphocytes Absolute: 2.8 10*3/uL (ref 0.7–3.1)
Lymphs: 28 %
MCH: 26.2 pg — ABNORMAL LOW (ref 26.6–33.0)
MCHC: 32.1 g/dL (ref 31.5–35.7)
MCV: 82 fL (ref 79–97)
Monocytes Absolute: 0.6 10*3/uL (ref 0.1–0.9)
Monocytes: 6 %
Neutrophils Absolute: 6.5 10*3/uL (ref 1.4–7.0)
Neutrophils: 64 %
Platelets: 332 10*3/uL (ref 150–450)
RBC: 5.22 x10E6/uL (ref 3.77–5.28)
RDW: 13.9 % (ref 11.7–15.4)
WBC: 10.1 10*3/uL (ref 3.4–10.8)

## 2022-02-16 LAB — COMPREHENSIVE METABOLIC PANEL
ALT: 28 IU/L (ref 0–32)
AST: 21 IU/L (ref 0–40)
Albumin/Globulin Ratio: 1.4 (ref 1.2–2.2)
Albumin: 4.3 g/dL (ref 3.8–4.8)
Alkaline Phosphatase: 113 IU/L (ref 44–121)
BUN/Creatinine Ratio: 16 (ref 9–23)
BUN: 14 mg/dL (ref 6–24)
Bilirubin Total: 0.4 mg/dL (ref 0.0–1.2)
CO2: 25 mmol/L (ref 20–29)
Calcium: 9.5 mg/dL (ref 8.7–10.2)
Chloride: 99 mmol/L (ref 96–106)
Creatinine, Ser: 0.85 mg/dL (ref 0.57–1.00)
Globulin, Total: 3.1 g/dL (ref 1.5–4.5)
Glucose: 149 mg/dL — ABNORMAL HIGH (ref 70–99)
Potassium: 4.2 mmol/L (ref 3.5–5.2)
Sodium: 139 mmol/L (ref 134–144)
Total Protein: 7.4 g/dL (ref 6.0–8.5)
eGFR: 84 mL/min/{1.73_m2} (ref >59)

## 2022-02-16 LAB — LIPID PANEL
Chol/HDL Ratio: 2 ratio (ref 0.0–4.4)
Cholesterol, Total: 113 mg/dL (ref 100–199)
HDL: 57 mg/dL (ref >39)
LDL Chol Calc (NIH): 40 mg/dL (ref 0–99)
Triglycerides: 81 mg/dL (ref 0–149)
VLDL Cholesterol Cal: 16 mg/dL (ref 5–40)

## 2022-02-16 LAB — C-PEPTIDE: C-Peptide: 5.3 ng/mL — ABNORMAL HIGH (ref 1.1–4.4)

## 2022-02-16 MED ORDER — METFORMIN HCL 500 MG PO TABS: 500.0000 mg | ORAL_TABLET | Freq: Two times a day (BID) | ORAL | 1 refills | Status: AC

## 2022-05-18 ENCOUNTER — Ambulatory Visit: Payer: BC Managed Care – PPO | Admitting: Nurse Practitioner

## 2022-06-06 ENCOUNTER — Ambulatory Visit: Payer: BC Managed Care – PPO | Admitting: Nurse Practitioner

## 2022-06-09 ENCOUNTER — Encounter: Payer: Self-pay | Admitting: Nurse Practitioner

## 2022-07-04 ENCOUNTER — Encounter: Payer: Self-pay | Admitting: Nurse Practitioner

## 2022-07-04 ENCOUNTER — Ambulatory Visit: Payer: BC Managed Care – PPO | Admitting: Nurse Practitioner

## 2024-07-16 ENCOUNTER — Other Ambulatory Visit: Payer: Self-pay | Admitting: Medical Genetics

## 2024-10-01 ENCOUNTER — Other Ambulatory Visit: Payer: Self-pay | Admitting: Medical Genetics

## 2024-10-01 DIAGNOSIS — Z006 Encounter for examination for normal comparison and control in clinical research program: Secondary | ICD-10-CM
